# Patient Record
Sex: Male | Born: 1950 | Race: Black or African American | Hispanic: No | Smoking: Never smoker
Health system: Southern US, Community
[De-identification: ages and names within clinical notes are randomized; demographics above are authoritative.]

## PROBLEM LIST (undated history)

## (undated) DIAGNOSIS — G5621 Lesion of ulnar nerve, right upper limb: Secondary | ICD-10-CM

## (undated) DIAGNOSIS — B369 Superficial mycosis, unspecified: Secondary | ICD-10-CM

## (undated) DIAGNOSIS — I10 Essential (primary) hypertension: Secondary | ICD-10-CM

## (undated) DIAGNOSIS — Z972 Presence of dental prosthetic device (complete) (partial): Secondary | ICD-10-CM

## (undated) DIAGNOSIS — G5600 Carpal tunnel syndrome, unspecified upper limb: Secondary | ICD-10-CM

## (undated) HISTORY — PX: COLONOSCOPY: SHX174

---

## 2001-01-31 ENCOUNTER — Other Ambulatory Visit: Admission: RE | Admit: 2001-01-31 | Discharge: 2001-01-31 | Payer: Self-pay | Admitting: *Deleted

## 2001-07-11 ENCOUNTER — Encounter (INDEPENDENT_AMBULATORY_CARE_PROVIDER_SITE_OTHER): Payer: Self-pay | Admitting: *Deleted

## 2001-07-11 ENCOUNTER — Ambulatory Visit (HOSPITAL_COMMUNITY): Admission: RE | Admit: 2001-07-11 | Discharge: 2001-07-11 | Payer: Self-pay | Admitting: Gastroenterology

## 2004-07-21 ENCOUNTER — Ambulatory Visit (HOSPITAL_COMMUNITY): Admission: RE | Admit: 2004-07-21 | Discharge: 2004-07-21 | Payer: Self-pay

## 2004-07-21 ENCOUNTER — Encounter (INDEPENDENT_AMBULATORY_CARE_PROVIDER_SITE_OTHER): Payer: Self-pay | Admitting: *Deleted

## 2005-12-09 ENCOUNTER — Emergency Department (HOSPITAL_COMMUNITY): Admission: EM | Admit: 2005-12-09 | Discharge: 2005-12-09 | Payer: Self-pay | Admitting: Emergency Medicine

## 2006-06-21 ENCOUNTER — Encounter: Admission: RE | Admit: 2006-06-21 | Discharge: 2006-06-21 | Payer: Self-pay | Admitting: Internal Medicine

## 2010-03-24 ENCOUNTER — Encounter: Admission: RE | Admit: 2010-03-24 | Discharge: 2010-03-24 | Payer: Self-pay | Admitting: Internal Medicine

## 2013-04-23 ENCOUNTER — Other Ambulatory Visit: Payer: Self-pay | Admitting: Gastroenterology

## 2013-07-24 ENCOUNTER — Other Ambulatory Visit: Payer: Self-pay | Admitting: Internal Medicine

## 2013-07-24 DIAGNOSIS — R229 Localized swelling, mass and lump, unspecified: Secondary | ICD-10-CM

## 2013-07-29 ENCOUNTER — Ambulatory Visit
Admission: RE | Admit: 2013-07-29 | Discharge: 2013-07-29 | Disposition: A | Discharge status: Home / Self Care | Payer: BC Managed Care – PPO | Source: Ambulatory Visit | Attending: Internal Medicine | Admitting: Internal Medicine

## 2013-07-29 DIAGNOSIS — R229 Localized swelling, mass and lump, unspecified: Secondary | ICD-10-CM

## 2015-02-25 ENCOUNTER — Emergency Department (INDEPENDENT_AMBULATORY_CARE_PROVIDER_SITE_OTHER)
Admission: EM | Admit: 2015-02-25 | Discharge: 2015-02-25 | Disposition: A | Discharge status: Home / Self Care | Payer: Medicare Other | Source: Home / Self Care

## 2015-02-25 ENCOUNTER — Encounter (HOSPITAL_COMMUNITY): Payer: Self-pay | Admitting: Emergency Medicine

## 2015-02-25 DIAGNOSIS — S61219A Laceration without foreign body of unspecified finger without damage to nail, initial encounter: Secondary | ICD-10-CM | POA: Diagnosis not present

## 2015-02-25 HISTORY — DX: Essential (primary) hypertension: I10

## 2015-02-25 MED ORDER — CEFUROXIME AXETIL 500 MG PO TABS: 500.0000 mg | ORAL_TABLET | Freq: Two times a day (BID) | ORAL | Status: DC

## 2015-02-25 NOTE — ED Notes (Signed)
Pt cut his left index finger with a band saw two days ago.  Cut is clean and dry.

## 2015-02-25 NOTE — ED Provider Notes (Signed)
Alexander Robles is a 64 y.o. male who presents to Urgent Care today for finger laceration. Patient suffered a laceration to the dorsal aspect of his left fourth digit at the distal phalanx 2 days ago at work. 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 The port slipped in his finger accidentally touched the band saw blade. He immediately pulled his hand away from the blade. The blade cut through his work gloves. He treated the wound with irrigation and a dressing. His last tetanus vaccine was one year ago. He feels well otherwise. No fevers or chills.   Past Medical History  Diagnosis Date  . Hypertension    History reviewed. No pertinent past surgical history. History  Substance Use Topics  . Smoking status: Never Smoker   . Smokeless tobacco: Never Used  . Alcohol Use: Yes     Comment: occasional   ROS as above Medications: No current facility-administered medications for this encounter.   Current Outpatient Prescriptions  Medication Sig Dispense Refill  . doxazosin (CARDURA XL) 4 MG 24 hr tablet Take 4 mg by mouth daily with breakfast.    . losartan-hydrochlorothiazide (HYZAAR) 100-25 MG per tablet Take 1 tablet by mouth daily.    . cefUROXime (CEFTIN) 500 MG tablet Take 1 tablet (500 mg total) by mouth 2 (two) times daily with a meal. 14 tablet 0   No Known Allergies   Exam:  BP 159/98 mmHg  Pulse 91  Temp(Src) 98.8 F (37.1 C) (Oral)  Resp 16  SpO2 100% Gen: Well NAD Left hand fourth digit distal phalanx laceration obliquely across the distal phalanx through the nail. It does not involve the joint. No deep structures involved. No bleeding. Well-appearing laceration. Distal cap refill and sensation intact.  No results found for this or any previous visit (from the past 24 hour(s)). No results found.  Assessment and Plan: 64 y.o. male with laceration to the left fourth digit distal phalanx. No deep structures involved. Laceration is at least 58 hours old. I cannot  repair with suture. We'll allow healing by secondary intention. Tetanus up-to-date. Treat with Ceftin for prophylactic antibiotics as patient cut his hand with a dirty saw contaminated with raw pork.  Informed patient that he will likely have a dystrophic fingernail. Return to work on Monday. Follow up with occupational health. Work related injury.  Discussed warning signs or symptoms. Please see discharge instructions. Patient expresses understanding.     Gregor Hams, MD 02/25/15 [phone]

## 2015-02-25 NOTE — Discharge Instructions (Signed)
Thank you for coming in today.  Laceration Care, Adult A laceration is a cut or lesion that goes through all layers of the skin and into the tissue just beneath the skin. TREATMENT  Some lacerations may not require closure. Some lacerations may not be able to be closed due to an increased risk of infection. It is important to see your caregiver as soon as possible after an injury to minimize the risk of infection and maximize the opportunity for successful closure. If closure is appropriate, pain medicines may be given, if needed. The wound will be cleaned to help prevent infection. Your caregiver will use stitches (sutures), staples, wound glue (adhesive), or skin adhesive strips to repair the laceration. These tools bring the skin edges together to allow for faster healing and a better cosmetic outcome. However, all wounds will heal with a scar. Once the wound has healed, scarring can be minimized by covering the wound with sunscreen during the day for 1 full year. HOME CARE INSTRUCTIONS  For sutures or staples:  Keep the wound clean and dry.  If you were given a bandage (dressing), you should change it at least once a day. Also, change the dressing if it becomes wet or dirty, or as directed by your caregiver.  Wash the wound with soap and water 2 times a day. Rinse the wound off with water to remove all soap. Pat the wound dry with a clean towel.  After cleaning, apply a thin layer of the antibiotic ointment as recommended by your caregiver. This will help prevent infection and keep the dressing from sticking.  You may shower as usual after the first 24 hours. Do not soak the wound in water until the sutures are removed.  Only take over-the-counter or prescription medicines for pain, discomfort, or fever as directed by your caregiver.  Get your sutures or staples removed as directed by your caregiver. For skin adhesive strips:  Keep the wound clean and dry.  Do not get the skin adhesive  strips wet. You may bathe carefully, using caution to keep the wound dry.  If the wound gets wet, pat it dry with a clean towel.  Skin adhesive strips will fall off on their own. You may trim the strips as the wound heals. Do not remove skin adhesive strips that are still stuck to the wound. They will fall off in time. For wound adhesive:  You may briefly wet your wound in the shower or bath. Do not soak or scrub the wound. Do not swim. Avoid periods of heavy perspiration until the skin adhesive has fallen off on its own. After showering or bathing, gently pat the wound dry with a clean towel.  Do not apply liquid medicine, cream medicine, or ointment medicine to your wound while the skin adhesive is in place. This may loosen the film before your wound is healed.  If a dressing is placed over the wound, be careful not to apply tape directly over the skin adhesive. This may cause the adhesive to be pulled off before the wound is healed.  Avoid prolonged exposure to sunlight or tanning lamps while the skin adhesive is in place. Exposure to ultraviolet light in the first year will darken the scar.  The skin adhesive will usually remain in place for 5 to 10 days, then naturally fall off the skin. Do not pick at the adhesive film. You may need a tetanus shot if:  You cannot remember when you had your last tetanus shot.  You have never had a tetanus shot. If you get a tetanus shot, your arm may swell, get red, and feel warm to the touch. This is common and not a problem. If you need a tetanus shot and you choose not to have one, there is a rare chance of getting tetanus. Sickness from tetanus can be serious. SEEK MEDICAL CARE IF:   You have redness, swelling, or increasing pain in the wound.  You see a red line that goes away from the wound.  You have yellowish-white fluid (pus) coming from the wound.  You have a fever.  You notice a bad smell coming from the wound or dressing.  Your  wound breaks open before or after sutures have been removed.  You notice something coming out of the wound such as wood or glass.  Your wound is on your hand or foot and you cannot move a finger or toe. SEEK IMMEDIATE MEDICAL CARE IF:   Your pain is not controlled with prescribed medicine.  You have severe swelling around the wound causing pain and numbness or a change in color in your arm, hand, leg, or foot.  Your wound splits open and starts bleeding.  You have worsening numbness, weakness, or loss of function of any joint around or beyond the wound.  You develop painful lumps near the wound or on the skin anywhere on your body. MAKE SURE YOU:   Understand these instructions.  Will watch your condition.  Will get help right away if you are not doing well or get worse. Document Released: 10/29/2005 Document Revised: 01/21/2012 Document Reviewed: 04/24/2011 [facility] Patient Information 2015 [place], [place]. This information is not intended to replace advice given to you by your health care provider. Make sure you discuss any questions you have with your health care provider.

## 2016-03-13 ENCOUNTER — Encounter (HOSPITAL_COMMUNITY): Payer: Self-pay | Admitting: Emergency Medicine

## 2016-03-13 ENCOUNTER — Ambulatory Visit (HOSPITAL_COMMUNITY)
Admission: EM | Admit: 2016-03-13 | Discharge: 2016-03-13 | Disposition: A | Discharge status: Home / Self Care | Payer: Medicare Other | Attending: Emergency Medicine | Admitting: Emergency Medicine

## 2016-03-13 DIAGNOSIS — Z9119 Patient's noncompliance with other medical treatment and regimen: Secondary | ICD-10-CM

## 2016-03-13 DIAGNOSIS — M7551 Bursitis of right shoulder: Secondary | ICD-10-CM

## 2016-03-13 DIAGNOSIS — I159 Secondary hypertension, unspecified: Secondary | ICD-10-CM | POA: Diagnosis not present

## 2016-03-13 DIAGNOSIS — Z9114 Patient's other noncompliance with medication regimen: Secondary | ICD-10-CM

## 2016-03-13 MED ORDER — DICLOFENAC SODIUM 1 % TD GEL: 1.0000 "application " | Freq: Four times a day (QID) | TRANSDERMAL | Status: DC

## 2016-03-13 MED ORDER — TRAMADOL HCL 50 MG PO TABS: ORAL_TABLET | ORAL | Status: DC

## 2016-03-13 NOTE — ED Provider Notes (Signed)
HPI  SUBJECTIVE:  Alexander Robles is a 65 y.o. male who presents with sharp constant, nonradiating right shoulder pain when he pulls his pants up starting yesterday. Symptoms are worse with the specific motion, no alleviating factors. He tried Tylenol arthritis for this. reports no pain with AB or AB duction, extension or flexion, internal/ external rotation. He states that he did a lot of rowing/pull-ups with a 15 pound dumbbell this weekend. He denies any other trauma to his shoulder, numbness, tingly, weakness, fevers, swelling, erythema. Past medical history includes hypertension, states that he ran out of his medications proximally one month ago. States that he takes "two meds" for his high blood pressure- a "blue pill and a yellow pill," but is not sure which one is which. Thinks that the yellow pill may be the losartan hydrochlorothiazide. Past medical history negative for shoulder injury, rotator cuff injury, diabetes, MI, kidney disease, stroke. PMD: Dr. Benita Stabile at [facility] [place].   Past Medical History  Diagnosis Date  . Hypertension     History reviewed. No pertinent past surgical history.  History reviewed. No pertinent family history.  Social History  Substance Use Topics  . Smoking status: Never Smoker   . Smokeless tobacco: Never Used  . Alcohol Use: Yes     Comment: occasional    No current facility-administered medications for this encounter.  Current outpatient prescriptions:  .  diclofenac sodium (VOLTAREN) 1 % GEL, Apply 1 application topically 4 (four) times daily., Disp: 100 g, Rfl: 0 .  doxazosin (CARDURA XL) 4 MG 24 hr tablet, Take 4 mg by mouth daily with breakfast., Disp: , Rfl:  .  losartan-hydrochlorothiazide (HYZAAR) 100-25 MG per tablet, Take 1 tablet by mouth daily., Disp: , Rfl:  .  traMADol (ULTRAM) 50 MG tablet, 1-2 tabs po q 6 hr prn pain Maximum dose= 8 tablets per day, Disp: 20 tablet, Rfl: 0  No Known Allergies   ROS  As noted in  HPI.   Physical Exam  BP 199/96 mmHg  Pulse 79  Temp(Src) 98.3 F (36.8 C) (Oral)  SpO2 99%   BP Readings from Last 3 Encounters:  03/13/16 199/96  02/25/15 159/98   Constitutional: Well developed, well nourished, no acute distress Eyes: PERRL, EOMI, conjunctiva normal bilaterally HENT: Normocephalic, atraumatic,mucus membranes moist Respiratory: Clear to auscultation bilaterally, no rales, no wheezing, no rhonchi Cardiovascular: Normal rate and rhythm, no murmurs, no gallops, no rubs GI: Soft, nondistended, normal bowel sounds, nontender, no rebound, no guarding Back: no CVAT skin: No rash, skin intact Musculoskeletal:calves symmetric, nontender No edema, no tenderness, no deformities Shoulder: R shoulder with ROM normal. Tenderness at the top of the shoulder, at the bursa.  Drop test normal , clavicle NT, A/C joint NT , scapula NT , proximal humerus NT, supraspinatus nontender,  Motor strength normal, Sensation intact LT over deltoid region, distal NVI with hand having intact sensation and strength in the distribution of the median, radial, and ulnar nerve.  no pain with internal rotation, no pain with external rotation, negative tenderness in bicipital groove, negative empty can test negative liftoff test, negative "popeye" sign, no instability with abduction/external rotation. Neurologic: Alert & oriented x 3, CN II-XII grossly intact, no motor deficits, sensation grossly intact Psychiatric: Speech and behavior appropriate   ED Course   Medications - No data to display  No orders of the defined types were placed in this encounter.   No results found for this or any previous visit (from the past 24  hour(s)). No results found.  ED Clinical Impression  Shoulder bursitis, right  Secondary hypertension, unspecified  H/O medication noncompliance  ED Assessment/Plan  Presentation most consistent with a shoulder bursitis. We'll send home with tramadol, voltaren gel as do  not know what kidney function is,Tylenol, ice, rest. Follow-up with orthopedics in 10 days if no better with this.  Pt hypertensive today. Has not taken BP meds in about a month. States that he has a yellow pill, which I believe is losartan/hydrochlorothiazide, and a blue pill, which I believe is Cardura. He states that he ran out of losartan hydrochlorothiazide one month ago, but she still has some of the Cardura at home. Pt has no evidence of end organ damage on hx. Pt denies any CNS type sx such as HA, visual changes, focal paresis, or new onset seizure activity. Pt denies any CV sx such as CP, dyspnea, palpitations, pedal edema, tearing pain radiating to back or abd. Pt denied any renal sx such as anuria or hematuria. Pt denies illicit drug use, most notably cocaine, or recent use of OTC medications such as nasal decongestants. Discussed importance of taking usual BP medications. He is to make a list of his medications as well so that he can help with the provider identify which medication is which. He states that he can get an appointment with his doctor tomorrow. We'll also send an inbox message to Dr. Donnamarie Rossetti at [place].   Discussed  plan and followup with patient. Discussed sn/sx that should prompt return to the  ED. Patient agrees with plan.   *This clinic note was created using Dragon dictation software. Therefore, there may be occasional mistakes despite careful proofreading.  ?   Melynda Ripple, MD 03/14/16 [phone]

## 2016-03-13 NOTE — ED Notes (Signed)
Pt here today with complaints of right shoulder pain.  He has full ROM but states he noticed extreme pain when trying to put his pants on and pull them up this morning.  Upon assessment, HBP was discovered.  Pt states he is supposed to be on HBP medication but has not seen his doctor in a year.  I was unable to determine exactly when he took his last HBP medication.

## 2016-03-13 NOTE — Discharge Instructions (Signed)
Decrease your salt intake. diet and exercise will lower your blood pressure significantly. It is important to keep your blood pressure under good control, as having a elevated for prolonged periods of time significantly increases your risk of stroke, heart attacks, kidney damage, eye damage, and other problems. Follow up with your doctor tomorrow. Return immediately to the ER if you start having chest pain, headache, problems seeing, problems talking, problems walking, if you feel like you're about to pass out, if you do pass out, if you have a seizure, or for any other concerns.[name]

## 2016-03-14 DIAGNOSIS — I10 Essential (primary) hypertension: Secondary | ICD-10-CM | POA: Diagnosis not present

## 2016-06-11 DIAGNOSIS — N471 Phimosis: Secondary | ICD-10-CM | POA: Diagnosis not present

## 2016-06-11 DIAGNOSIS — I10 Essential (primary) hypertension: Secondary | ICD-10-CM | POA: Diagnosis not present

## 2016-06-11 DIAGNOSIS — M179 Osteoarthritis of knee, unspecified: Secondary | ICD-10-CM | POA: Diagnosis not present

## 2016-06-11 DIAGNOSIS — S3021XA Contusion of penis, initial encounter: Secondary | ICD-10-CM | POA: Diagnosis not present

## 2016-06-11 DIAGNOSIS — R030 Elevated blood-pressure reading, without diagnosis of hypertension: Secondary | ICD-10-CM | POA: Diagnosis not present

## 2016-06-11 DIAGNOSIS — N4889 Other specified disorders of penis: Secondary | ICD-10-CM | POA: Diagnosis not present

## 2016-06-15 DIAGNOSIS — N471 Phimosis: Secondary | ICD-10-CM | POA: Diagnosis not present

## 2016-06-15 DIAGNOSIS — N5201 Erectile dysfunction due to arterial insufficiency: Secondary | ICD-10-CM | POA: Diagnosis not present

## 2016-06-26 DIAGNOSIS — M179 Osteoarthritis of knee, unspecified: Secondary | ICD-10-CM | POA: Diagnosis not present

## 2016-06-26 DIAGNOSIS — N471 Phimosis: Secondary | ICD-10-CM | POA: Diagnosis not present

## 2016-06-26 DIAGNOSIS — I10 Essential (primary) hypertension: Secondary | ICD-10-CM | POA: Diagnosis not present

## 2016-08-10 DIAGNOSIS — R7303 Prediabetes: Secondary | ICD-10-CM | POA: Diagnosis not present

## 2016-08-10 DIAGNOSIS — Z125 Encounter for screening for malignant neoplasm of prostate: Secondary | ICD-10-CM | POA: Diagnosis not present

## 2016-08-10 DIAGNOSIS — J309 Allergic rhinitis, unspecified: Secondary | ICD-10-CM | POA: Diagnosis not present

## 2016-08-10 DIAGNOSIS — I10 Essential (primary) hypertension: Secondary | ICD-10-CM | POA: Diagnosis not present

## 2016-08-10 DIAGNOSIS — Z1159 Encounter for screening for other viral diseases: Secondary | ICD-10-CM | POA: Diagnosis not present

## 2016-08-10 DIAGNOSIS — Z1389 Encounter for screening for other disorder: Secondary | ICD-10-CM | POA: Diagnosis not present

## 2016-08-10 DIAGNOSIS — N529 Male erectile dysfunction, unspecified: Secondary | ICD-10-CM | POA: Diagnosis not present

## 2016-08-10 DIAGNOSIS — E669 Obesity, unspecified: Secondary | ICD-10-CM | POA: Diagnosis not present

## 2016-08-10 DIAGNOSIS — M519 Unspecified thoracic, thoracolumbar and lumbosacral intervertebral disc disorder: Secondary | ICD-10-CM | POA: Diagnosis not present

## 2016-08-10 DIAGNOSIS — Z23 Encounter for immunization: Secondary | ICD-10-CM | POA: Diagnosis not present

## 2016-08-10 DIAGNOSIS — Z0000 Encounter for general adult medical examination without abnormal findings: Secondary | ICD-10-CM | POA: Diagnosis not present

## 2016-10-11 ENCOUNTER — Ambulatory Visit (INDEPENDENT_AMBULATORY_CARE_PROVIDER_SITE_OTHER): Payer: Medicare Other

## 2016-10-11 ENCOUNTER — Ambulatory Visit (HOSPITAL_COMMUNITY)
Admission: EM | Admit: 2016-10-11 | Discharge: 2016-10-11 | Disposition: A | Discharge status: Home / Self Care | Payer: Medicare Other | Attending: Family Medicine | Admitting: Family Medicine

## 2016-10-11 ENCOUNTER — Encounter (HOSPITAL_COMMUNITY): Payer: Self-pay | Admitting: Emergency Medicine

## 2016-10-11 DIAGNOSIS — M25551 Pain in right hip: Secondary | ICD-10-CM

## 2016-10-11 DIAGNOSIS — M25559 Pain in unspecified hip: Secondary | ICD-10-CM | POA: Diagnosis not present

## 2016-10-11 DIAGNOSIS — T148XXA Other injury of unspecified body region, initial encounter: Secondary | ICD-10-CM

## 2016-10-11 DIAGNOSIS — M1611 Unilateral primary osteoarthritis, right hip: Secondary | ICD-10-CM | POA: Diagnosis not present

## 2016-10-11 DIAGNOSIS — M79609 Pain in unspecified limb: Secondary | ICD-10-CM

## 2016-10-11 DIAGNOSIS — R52 Pain, unspecified: Secondary | ICD-10-CM | POA: Diagnosis not present

## 2016-10-11 MED ORDER — NAPROXEN 375 MG PO TABS: 375.0000 mg | ORAL_TABLET | Freq: Two times a day (BID) | ORAL | 0 refills | Status: DC

## 2016-10-11 MED ORDER — TRAMADOL HCL 50 MG PO TABS: 50.0000 mg | ORAL_TABLET | Freq: Four times a day (QID) | ORAL | 0 refills | Status: DC | PRN

## 2016-10-11 NOTE — ED Notes (Signed)
Instructed to put on gown 

## 2016-10-11 NOTE — ED Triage Notes (Signed)
Pt was suffering from right lower back pain a few months ago and was given Robaxin and Celebrex for the pain, but it did not help him.  Pt is here today for pain in the same area but much more intense than before.  Pt has no urinary symptoms and no fever.

## 2016-10-11 NOTE — ED Provider Notes (Signed)
CSN: [id number]     Arrival date & time 10/11/16  1014 History   First MD Initiated Contact with Patient 10/11/16 1205     Chief Complaint  Patient presents with  . Back Pain   (Consider location/radiation/quality/duration/timing/severity/associated sxs/prior Treatment) 65 year old male complaining of back pain on the right side for 2 months. He states that he had fallen. He is unable to give any more specifics regarding the onset of pain the pain is located over the right hip particularly in the lateral iliac area. He saw his PCP approximately 2 months ago and was given a couple of medications. He felt that the medication would cure the ailment  after finishing the medication, but he was no better. He did not attempt to go back and see his PCP. He states the pain is constant and nonradiating. It is worse with movement, lifting the leg, ambulation, the act of sitting and standing.      Past Medical History:  Diagnosis Date  . Hypertension    History reviewed. No pertinent surgical history. History reviewed. No pertinent family history. Social History  Substance Use Topics  . Smoking status: Never Smoker  . Smokeless tobacco: Never Used  . Alcohol use Yes     Comment: occasional    Review of Systems  Constitutional: Positive for activity change.  HENT: Negative.   Respiratory: Negative.   Gastrointestinal: Negative.   Genitourinary: Negative.   Musculoskeletal: Positive for back pain and myalgias. Negative for joint swelling, neck pain and neck stiffness.       As per HPI  Skin: Negative.   Neurological: Negative for dizziness, weakness, numbness and headaches.  All other systems reviewed and are negative.   Allergies  Patient has no known allergies.  Home Medications   Prior to Admission medications   Medication Sig Start Date End Date Taking? Authorizing Provider  diclofenac sodium (VOLTAREN) 1 % GEL Apply 1 application topically 4 (four) times daily. 03/13/16  Yes  Melynda Ripple, MD  doxazosin (CARDURA XL) 4 MG 24 hr tablet Take 4 mg by mouth daily with breakfast.   Yes Historical Provider, MD  losartan-hydrochlorothiazide (HYZAAR) 100-25 MG per tablet Take 1 tablet by mouth daily.   Yes Historical Provider, MD  methocarbamol (ROBAXIN) 500 MG tablet Take 500 mg by mouth 3 (three) times daily.   Yes Historical Provider, MD  naproxen (NAPROSYN) 375 MG tablet Take 1 tablet (375 mg total) by mouth 2 (two) times daily. Take with food. 10/11/16   Janne Napoleon, NP  traMADol (ULTRAM) 50 MG tablet Take 1 tablet (50 mg total) by mouth every 6 (six) hours as needed. 10/11/16   Janne Napoleon, NP   Meds Ordered and Administered this Visit  Medications - No data to display  BP 112/74 (BP Location: Right Arm)   Pulse 78   Temp 98 F (36.7 C) (Oral)   SpO2 96%  No data found.   Physical Exam  Constitutional: He is oriented to person, place, and time. He appears well-developed and well-nourished. No distress.  HENT:  Head: Normocephalic and atraumatic.  Eyes: EOM are normal.  Neck: Normal range of motion. Neck supple.  Cardiovascular: Normal rate.   Pulmonary/Chest: Effort normal. No respiratory distress.  Musculoskeletal: He exhibits no edema.  The patient points to the ventricular gluteal, gluteus medius and aponeurosis area of the right pelvis as the source of pain. Am unable to reproduce pain with deep palpation. I will having the patient lie supine and performing a straight  leg lifts this reproduces the pain and he is unable to maintain this position for a long period having the patient abduct the right leg also produces pain in the same area. No tenderness to the lateral thigh with percussion or palpation. No tenderness to the anterior thigh. No deformity, swelling or discoloration.  Neurological: He is alert and oriented to person, place, and time. He exhibits normal muscle tone.  Skin: Skin is warm and dry.  Psychiatric: He has a normal mood and affect.   Nursing note and vitals reviewed.   Urgent Care Course   Clinical Course     Procedures (including critical care time)  Labs Review Labs Reviewed - No data to display  Imaging Review Dg Hip Unilat With Pelvis 2-3 Views Right  Result Date: 10/11/2016 CLINICAL DATA:  65 year old male with a history of right lower hip pain for 2 months. EXAM: DG HIP (WITH OR WITHOUT PELVIS) 2-3V RIGHT COMPARISON:  CT abdomen 03/24/2010 FINDINGS: No displaced acute fracture identified. Bilateral changes of hip osteoarthritis. No focal soft tissue swelling.  No radiopaque foreign body. IMPRESSION: Negative for acute bony abnormality. Evidence of developing bilateral osteoarthritis. Signed, Dulcy Fanny. Earleen Newport, DO [facility] Electronically Signed   By: Corrie Mckusick D.O.   On: 10/11/2016 12:54     Visual Acuity Review  Right Eye Distance:   Left Eye Distance:   Bilateral Distance:    Right Eye Near:   Left Eye Near:    Bilateral Near:         MDM   1. Musculoskeletal pain of extremity   2. Pain, hip   3. Hip pain, acute, right   4. Muscle strain   5. Pain of right hip joint    The pain in the right pelvis and hip appears to be musculoskeletal. The bones are intact, nothing appears to be broken or out of place. He did have some arthritis but that does not seem to be the cause of all of the problem. Most of the problem appears to rest within the muscles and maybe even the tendons to a portion of the hip and the right upper pelvis. Apply heat to the muscles to 3 times a day. Take the medications as directed. He may need to have physical therapy. Call the orthopedist listed on page one for an appointment and follow-up. Meds ordered this encounter  Medications  . methocarbamol (ROBAXIN) 500 MG tablet    Sig: Take 500 mg by mouth 3 (three) times daily.  [name] DISCONTD: celecoxib (CELEBREX) 200 MG capsule    Sig: Take 200 mg by mouth daily.  .  naproxen (NAPROSYN) 375 MG tablet    Sig: Take 1 tablet (375 mg total) by mouth 2 (two) times daily. Take with food.    Dispense:  20 tablet    Refill:  0    Order Specific Question:   Supervising Provider    Answer:   Melony Overly [id number]  . traMADol (ULTRAM) 50 MG tablet    Sig: Take 1 tablet (50 mg total) by mouth every 6 (six) hours as needed.    Dispense:  15 tablet    Refill:  0    Order Specific Question:   Supervising Provider    Answer:   Carmela Hurt       Janne Napoleon, NP 10/11/16 1335

## 2016-10-11 NOTE — Discharge Instructions (Signed)
The pain in the right pelvis and hip appears to be musculoskeletal. The bones are intact, nothing appears to be broken or out of place. He did have some arthritis but that does not seem to be the cause of all of the problem. Most of the problem appears to rest within the muscles and maybe even the tendons to a portion of the hip and the right upper pelvis. Apply heat to the muscles to 3 times a day. Take the medications as directed. He may need to have physical therapy. Call the orthopedist listed on page one for an appointment and follow-up.

## 2017-01-25 DIAGNOSIS — M5136 Other intervertebral disc degeneration, lumbar region: Secondary | ICD-10-CM | POA: Diagnosis not present

## 2017-01-25 DIAGNOSIS — M170 Bilateral primary osteoarthritis of knee: Secondary | ICD-10-CM | POA: Diagnosis not present

## 2017-01-25 DIAGNOSIS — M545 Low back pain: Secondary | ICD-10-CM | POA: Diagnosis not present

## 2017-02-01 DIAGNOSIS — I10 Essential (primary) hypertension: Secondary | ICD-10-CM | POA: Diagnosis not present

## 2017-02-01 DIAGNOSIS — R7303 Prediabetes: Secondary | ICD-10-CM | POA: Diagnosis not present

## 2017-02-01 DIAGNOSIS — J309 Allergic rhinitis, unspecified: Secondary | ICD-10-CM | POA: Diagnosis not present

## 2017-02-01 DIAGNOSIS — M179 Osteoarthritis of knee, unspecified: Secondary | ICD-10-CM | POA: Diagnosis not present

## 2017-07-08 DIAGNOSIS — R202 Paresthesia of skin: Secondary | ICD-10-CM | POA: Diagnosis not present

## 2017-07-12 DIAGNOSIS — G5621 Lesion of ulnar nerve, right upper limb: Secondary | ICD-10-CM | POA: Diagnosis not present

## 2017-07-17 ENCOUNTER — Other Ambulatory Visit: Payer: Self-pay | Admitting: Orthopedic Surgery

## 2017-07-17 DIAGNOSIS — G5621 Lesion of ulnar nerve, right upper limb: Secondary | ICD-10-CM | POA: Diagnosis not present

## 2017-07-17 DIAGNOSIS — G5622 Lesion of ulnar nerve, left upper limb: Secondary | ICD-10-CM | POA: Diagnosis not present

## 2017-07-17 DIAGNOSIS — G5602 Carpal tunnel syndrome, left upper limb: Secondary | ICD-10-CM | POA: Diagnosis not present

## 2017-07-17 DIAGNOSIS — G5603 Carpal tunnel syndrome, bilateral upper limbs: Secondary | ICD-10-CM | POA: Diagnosis not present

## 2017-07-26 DIAGNOSIS — I10 Essential (primary) hypertension: Secondary | ICD-10-CM | POA: Diagnosis not present

## 2017-07-26 DIAGNOSIS — E669 Obesity, unspecified: Secondary | ICD-10-CM | POA: Diagnosis not present

## 2017-07-26 DIAGNOSIS — Z1211 Encounter for screening for malignant neoplasm of colon: Secondary | ICD-10-CM | POA: Diagnosis not present

## 2017-07-26 DIAGNOSIS — E782 Mixed hyperlipidemia: Secondary | ICD-10-CM | POA: Diagnosis not present

## 2017-07-26 DIAGNOSIS — J309 Allergic rhinitis, unspecified: Secondary | ICD-10-CM | POA: Diagnosis not present

## 2017-07-26 DIAGNOSIS — M519 Unspecified thoracic, thoracolumbar and lumbosacral intervertebral disc disorder: Secondary | ICD-10-CM | POA: Diagnosis not present

## 2017-07-26 DIAGNOSIS — R7309 Other abnormal glucose: Secondary | ICD-10-CM | POA: Diagnosis not present

## 2017-07-26 DIAGNOSIS — G5600 Carpal tunnel syndrome, unspecified upper limb: Secondary | ICD-10-CM | POA: Diagnosis not present

## 2017-07-26 DIAGNOSIS — Z1389 Encounter for screening for other disorder: Secondary | ICD-10-CM | POA: Diagnosis not present

## 2017-07-26 DIAGNOSIS — Z0000 Encounter for general adult medical examination without abnormal findings: Secondary | ICD-10-CM | POA: Diagnosis not present

## 2017-07-26 DIAGNOSIS — Z7189 Other specified counseling: Secondary | ICD-10-CM | POA: Diagnosis not present

## 2017-07-26 DIAGNOSIS — R7303 Prediabetes: Secondary | ICD-10-CM | POA: Diagnosis not present

## 2017-07-26 DIAGNOSIS — Z23 Encounter for immunization: Secondary | ICD-10-CM | POA: Diagnosis not present

## 2017-07-26 DIAGNOSIS — Z6836 Body mass index (BMI) 36.0-36.9, adult: Secondary | ICD-10-CM | POA: Diagnosis not present

## 2017-07-26 DIAGNOSIS — N400 Enlarged prostate without lower urinary tract symptoms: Secondary | ICD-10-CM | POA: Diagnosis not present

## 2017-07-26 DIAGNOSIS — M179 Osteoarthritis of knee, unspecified: Secondary | ICD-10-CM | POA: Diagnosis not present

## 2017-07-29 ENCOUNTER — Encounter (HOSPITAL_BASED_OUTPATIENT_CLINIC_OR_DEPARTMENT_OTHER): Payer: Self-pay | Admitting: *Deleted

## 2017-07-29 NOTE — Pre-Procedure Instructions (Signed)
To come for BMET and EKG 

## 2017-07-29 NOTE — Progress Notes (Signed)
   07/29/17 1536  OBSTRUCTIVE SLEEP APNEA  Have you ever been diagnosed with sleep apnea through a sleep study? No  Do you snore loudly (loud enough to be heard through closed doors)?  0  Do you often feel tired, fatigued, or sleepy during the daytime (such as falling asleep during driving or talking to someone)? 0  Has anyone observed you stop breathing during your sleep? 0  Do you have, or are you being treated for high blood pressure? 1  BMI more than 35 kg/m2? 1  Age > 61 (1-yes) 1  Male Gender (Yes=1) 1  Obstructive Sleep Apnea Score 4  Score 5 or greater  Results sent to PCP

## 2017-08-01 ENCOUNTER — Encounter (HOSPITAL_BASED_OUTPATIENT_CLINIC_OR_DEPARTMENT_OTHER)
Admission: RE | Admit: 2017-08-01 | Discharge: 2017-08-01 | Disposition: A | Discharge status: Home / Self Care | Payer: Medicare Other | Source: Ambulatory Visit | Attending: Orthopedic Surgery | Admitting: Orthopedic Surgery

## 2017-08-01 DIAGNOSIS — G5601 Carpal tunnel syndrome, right upper limb: Secondary | ICD-10-CM | POA: Diagnosis not present

## 2017-08-01 DIAGNOSIS — Z79899 Other long term (current) drug therapy: Secondary | ICD-10-CM | POA: Diagnosis not present

## 2017-08-01 DIAGNOSIS — G5621 Lesion of ulnar nerve, right upper limb: Secondary | ICD-10-CM | POA: Diagnosis not present

## 2017-08-01 DIAGNOSIS — I10 Essential (primary) hypertension: Secondary | ICD-10-CM | POA: Diagnosis not present

## 2017-08-01 LAB — BASIC METABOLIC PANEL
Anion gap: 9 (ref 5–15)
BUN: 13 mg/dL (ref 6–20)
CO2: 26 mmol/L (ref 22–32)
Calcium: 8.7 mg/dL — ABNORMAL LOW (ref 8.9–10.3)
Chloride: 108 mmol/L (ref 101–111)
Creatinine, Ser: 0.9 mg/dL (ref 0.61–1.24)
GFR calc Af Amer: >60 mL/min (ref >60)
GFR calc non Af Amer: >60 mL/min (ref >60)
Glucose, Bld: 111 mg/dL — ABNORMAL HIGH (ref 65–99)
Potassium: 4 mmol/L (ref 3.5–5.1)
Sodium: 143 mmol/L (ref 135–145)

## 2017-08-01 NOTE — Progress Notes (Signed)
EKG reviewed by Dr. Miller, will proceed with surgery as scheduled.  

## 2017-08-02 ENCOUNTER — Encounter (HOSPITAL_BASED_OUTPATIENT_CLINIC_OR_DEPARTMENT_OTHER)
Admission: RE | Disposition: A | Discharge status: Home / Self Care | Payer: Self-pay | Source: Ambulatory Visit | Attending: Orthopedic Surgery

## 2017-08-02 ENCOUNTER — Ambulatory Visit (HOSPITAL_BASED_OUTPATIENT_CLINIC_OR_DEPARTMENT_OTHER)
Admission: RE | Admit: 2017-08-02 | Discharge: 2017-08-02 | Disposition: A | Discharge status: Home / Self Care | Payer: Medicare Other | Source: Ambulatory Visit | Attending: Orthopedic Surgery | Admitting: Orthopedic Surgery

## 2017-08-02 ENCOUNTER — Ambulatory Visit (HOSPITAL_BASED_OUTPATIENT_CLINIC_OR_DEPARTMENT_OTHER): Payer: Medicare Other | Admitting: Anesthesiology

## 2017-08-02 ENCOUNTER — Encounter (HOSPITAL_BASED_OUTPATIENT_CLINIC_OR_DEPARTMENT_OTHER): Payer: Self-pay | Admitting: *Deleted

## 2017-08-02 DIAGNOSIS — G5621 Lesion of ulnar nerve, right upper limb: Secondary | ICD-10-CM | POA: Diagnosis not present

## 2017-08-02 DIAGNOSIS — G5601 Carpal tunnel syndrome, right upper limb: Secondary | ICD-10-CM | POA: Diagnosis not present

## 2017-08-02 DIAGNOSIS — Z79899 Other long term (current) drug therapy: Secondary | ICD-10-CM | POA: Diagnosis not present

## 2017-08-02 DIAGNOSIS — I10 Essential (primary) hypertension: Secondary | ICD-10-CM | POA: Insufficient documentation

## 2017-08-02 DIAGNOSIS — G8918 Other acute postprocedural pain: Secondary | ICD-10-CM | POA: Diagnosis not present

## 2017-08-02 HISTORY — DX: Carpal tunnel syndrome, unspecified upper limb: G56.00

## 2017-08-02 HISTORY — DX: Presence of dental prosthetic device (complete) (partial): Z97.2

## 2017-08-02 HISTORY — DX: Superficial mycosis, unspecified: B36.9

## 2017-08-02 HISTORY — DX: Lesion of ulnar nerve, right upper limb: G56.21

## 2017-08-02 HISTORY — PX: CARPAL TUNNEL RELEASE: SHX101

## 2017-08-02 HISTORY — PX: ULNAR NERVE TRANSPOSITION: SHX2595

## 2017-08-02 SURGERY — CARPAL TUNNEL RELEASE
Anesthesia: General | Site: Hand | Laterality: Right

## 2017-08-02 MED ORDER — CEFAZOLIN SODIUM-DEXTROSE 2-4 GM/100ML-% IV SOLN: 2.0000 g | INTRAVENOUS | Status: DC

## 2017-08-02 MED ORDER — MIDAZOLAM HCL 2 MG/2ML IJ SOLN
1.0000 mg | INTRAMUSCULAR | Status: DC | PRN
  Administered 2017-08-02: 2 mg via INTRAVENOUS

## 2017-08-02 MED ORDER — HYDROCODONE-ACETAMINOPHEN 10-325 MG PO TABS: 1.0000 | ORAL_TABLET | Freq: Four times a day (QID) | ORAL | 0 refills | Status: DC | PRN

## 2017-08-02 MED ORDER — CHLORHEXIDINE GLUCONATE 4 % EX LIQD: 60.0000 mL | Freq: Once | CUTANEOUS | Status: DC

## 2017-08-02 MED ORDER — CEFAZOLIN SODIUM-DEXTROSE 2-4 GM/100ML-% IV SOLN
INTRAVENOUS | Status: AC
  Filled 2017-08-02: qty 100

## 2017-08-02 MED ORDER — BUPIVACAINE-EPINEPHRINE (PF) 0.5% -1:200000 IJ SOLN
INTRAMUSCULAR | Status: DC | PRN
  Administered 2017-08-02: 30 mL via PERINEURAL

## 2017-08-02 MED ORDER — FENTANYL CITRATE (PF) 100 MCG/2ML IJ SOLN
50.0000 ug | INTRAMUSCULAR | Status: AC | PRN
  Administered 2017-08-02 (×3): 50 ug via INTRAVENOUS

## 2017-08-02 MED ORDER — CEFAZOLIN SODIUM-DEXTROSE 2-3 GM-% IV SOLR
INTRAVENOUS | Status: DC | PRN
  Administered 2017-08-02: 2 g via INTRAVENOUS

## 2017-08-02 MED ORDER — PHENYLEPHRINE HCL 10 MG/ML IJ SOLN
INTRAMUSCULAR | Status: DC | PRN
  Administered 2017-08-02 (×3): 80 ug via INTRAVENOUS

## 2017-08-02 MED ORDER — EPHEDRINE SULFATE 50 MG/ML IJ SOLN
INTRAMUSCULAR | Status: DC | PRN
  Administered 2017-08-02: 15 mg via INTRAVENOUS
  Administered 2017-08-02 (×2): 10 mg via INTRAVENOUS

## 2017-08-02 MED ORDER — PROPOFOL 10 MG/ML IV BOLUS
INTRAVENOUS | Status: AC
  Filled 2017-08-02: qty 20

## 2017-08-02 MED ORDER — GLYCOPYRROLATE 0.2 MG/ML IJ SOLN
INTRAMUSCULAR | Status: DC | PRN
  Administered 2017-08-02: 0.2 mg via INTRAVENOUS

## 2017-08-02 MED ORDER — FENTANYL CITRATE (PF) 100 MCG/2ML IJ SOLN
INTRAMUSCULAR | Status: AC
  Filled 2017-08-02: qty 2

## 2017-08-02 MED ORDER — PROMETHAZINE HCL 25 MG/ML IJ SOLN: 6.2500 mg | INTRAMUSCULAR | Status: DC | PRN

## 2017-08-02 MED ORDER — PROPOFOL 10 MG/ML IV BOLUS
INTRAVENOUS | Status: DC | PRN
  Administered 2017-08-02 (×2): 200 mg via INTRAVENOUS
  Administered 2017-08-02: 100 mg via INTRAVENOUS

## 2017-08-02 MED ORDER — SCOPOLAMINE 1 MG/3DAYS TD PT72
1.0000 | MEDICATED_PATCH | Freq: Once | TRANSDERMAL | Status: DC | PRN
Start: 2017-08-02 — End: 2017-08-02

## 2017-08-02 MED ORDER — DEXAMETHASONE SODIUM PHOSPHATE 10 MG/ML IJ SOLN
INTRAMUSCULAR | Status: DC | PRN
  Administered 2017-08-02: 10 mg via INTRAVENOUS

## 2017-08-02 MED ORDER — ONDANSETRON HCL 4 MG/2ML IJ SOLN
INTRAMUSCULAR | Status: DC | PRN
  Administered 2017-08-02: 4 mg via INTRAVENOUS

## 2017-08-02 MED ORDER — BUPIVACAINE HCL (PF) 0.25 % IJ SOLN
INTRAMUSCULAR | Status: AC
  Filled 2017-08-02: qty 30

## 2017-08-02 MED ORDER — ONDANSETRON HCL 4 MG/2ML IJ SOLN
INTRAMUSCULAR | Status: AC
  Filled 2017-08-02: qty 2

## 2017-08-02 MED ORDER — MIDAZOLAM HCL 2 MG/2ML IJ SOLN
INTRAMUSCULAR | Status: AC
  Filled 2017-08-02: qty 2

## 2017-08-02 MED ORDER — LACTATED RINGERS IV SOLN
INTRAVENOUS | Status: DC
  Administered 2017-08-02 (×2): via INTRAVENOUS

## 2017-08-02 MED ORDER — HYDROMORPHONE HCL 1 MG/ML IJ SOLN: 0.2500 mg | INTRAMUSCULAR | Status: DC | PRN

## 2017-08-02 SURGICAL SUPPLY — 50 items
BLADE MINI RND TIP GREEN BEAV (BLADE) IMPLANT
BLADE SURG 15 STRL LF DISP TIS (BLADE) ×2 IMPLANT
BLADE SURG 15 STRL SS (BLADE) ×3
BNDG CMPR 9X4 STRL LF SNTH (GAUZE/BANDAGES/DRESSINGS) ×2
BNDG COHESIVE 3X5 TAN STRL LF (GAUZE/BANDAGES/DRESSINGS) ×6 IMPLANT
BNDG ESMARK 4X9 LF (GAUZE/BANDAGES/DRESSINGS) ×3 IMPLANT
BNDG GAUZE ELAST 4 BULKY (GAUZE/BANDAGES/DRESSINGS) ×3 IMPLANT
CHLORAPREP W/TINT 26ML (MISCELLANEOUS) ×3 IMPLANT
CORD BIPOLAR FORCEPS 12FT (ELECTRODE) ×3 IMPLANT
COVER BACK TABLE 60X90IN (DRAPES) ×3 IMPLANT
COVER MAYO STAND STRL (DRAPES) ×3 IMPLANT
CUFF TOURN SGL LL 18 NRW (TOURNIQUET CUFF) ×3 IMPLANT
CUFF TOURNIQUET SINGLE 18IN (TOURNIQUET CUFF) ×2 IMPLANT
DECANTER SPIKE VIAL GLASS SM (MISCELLANEOUS) IMPLANT
DRAPE EXTREMITY T 121X128X90 (DRAPE) ×3 IMPLANT
DRAPE SURG 17X23 STRL (DRAPES) ×3 IMPLANT
DRSG PAD ABDOMINAL 8X10 ST (GAUZE/BANDAGES/DRESSINGS) ×4 IMPLANT
GAUZE SPONGE 4X4 12PLY STRL (GAUZE/BANDAGES/DRESSINGS) ×3 IMPLANT
GAUZE SPONGE 4X4 16PLY XRAY LF (GAUZE/BANDAGES/DRESSINGS) IMPLANT
GAUZE XEROFORM 1X8 LF (GAUZE/BANDAGES/DRESSINGS) ×3 IMPLANT
GLOVE BIO SURGEON STRL SZ7 (GLOVE) ×1 IMPLANT
GLOVE BIOGEL PI IND STRL 8.5 (GLOVE) ×2 IMPLANT
GLOVE BIOGEL PI INDICATOR 8.5 (GLOVE) ×1
GLOVE EXAM NITRILE MD LF STRL (GLOVE) ×1 IMPLANT
GLOVE SURG ORTHO 8.0 STRL STRW (GLOVE) ×3 IMPLANT
GOWN STRL REUS W/ TWL LRG LVL3 (GOWN DISPOSABLE) ×2 IMPLANT
GOWN STRL REUS W/ TWL XL LVL3 (GOWN DISPOSABLE) IMPLANT
GOWN STRL REUS W/TWL LRG LVL3 (GOWN DISPOSABLE)
GOWN STRL REUS W/TWL XL LVL3 (GOWN DISPOSABLE) ×6 IMPLANT
LOOP VESSEL MAXI BLUE (MISCELLANEOUS) IMPLANT
NDL PRECISIONGLIDE 27X1.5 (NEEDLE) IMPLANT
NEEDLE PRECISIONGLIDE 27X1.5 (NEEDLE) IMPLANT
NS IRRIG 1000ML POUR BTL (IV SOLUTION) ×3 IMPLANT
PACK BASIN DAY SURGERY FS (CUSTOM PROCEDURE TRAY) ×3 IMPLANT
PAD CAST 3X4 CTTN HI CHSV (CAST SUPPLIES) IMPLANT
PAD CAST 4YDX4 CTTN HI CHSV (CAST SUPPLIES) ×2 IMPLANT
PADDING CAST COTTON 3X4 STRL (CAST SUPPLIES)
PADDING CAST COTTON 4X4 STRL (CAST SUPPLIES) ×3
SLEEVE SCD COMPRESS KNEE MED (MISCELLANEOUS) ×3 IMPLANT
SPLINT PLASTER CAST XFAST 3X15 (CAST SUPPLIES) IMPLANT
SPLINT PLASTER XTRA FASTSET 3X (CAST SUPPLIES)
STOCKINETTE 4X48 STRL (DRAPES) ×3 IMPLANT
SUT ETHILON 4 0 PS 2 18 (SUTURE) ×3 IMPLANT
SUT VIC AB 2-0 SH 27 (SUTURE) ×3
SUT VIC AB 2-0 SH 27XBRD (SUTURE) ×2 IMPLANT
SUT VICRYL 4-0 PS2 18IN ABS (SUTURE) ×3 IMPLANT
SYR BULB 3OZ (MISCELLANEOUS) ×3 IMPLANT
SYR CONTROL 10ML LL (SYRINGE) IMPLANT
TOWEL OR 17X24 6PK STRL BLUE (TOWEL DISPOSABLE) ×3 IMPLANT
UNDERPAD 30X30 (UNDERPADS AND DIAPERS) ×3 IMPLANT

## 2017-08-02 NOTE — Progress Notes (Signed)
Assisted Dr. Rob Fitzgerald with right, ultrasound guided, supraclavicular block. Side rails up, monitors on throughout procedure. See vital signs in flow sheet. Tolerated Procedure well. 

## 2017-08-02 NOTE — H&P (Signed)
Alexander Robles is an 66 y.o. male.   Chief Complaint: numbness hands HPI: Alexander Robles is a 66 year old right-hand-dominant male referred by Dr. Deforest Hoyles for consultation regarding numbness and tingling right ring and small fingers. The this been going up past 6-7 months increasing over the past 2 weeks he is not complaining of cramping and numbness and tingling a relatively constant basis. Is not complaining of pain. He is complaining of his left side. He has no history of injury to his elbow neck or hand. He did play sports including wrestling and football when he was younger. He has tried ibuprofen without relief. He states cutting meat aggravates it at the end of the day. He is not complaining of increased cramping at that time. He has no history of diabetes thyroid problems arthritis gout. Family history is positive diabetes negative for thyroid problems arthritis gout. He has been told he is borderline.His nerve conductions are done by Dr. Thereasa Parkin revealing a bilateral carpal tunnel syndrome with no response to the sensory component greater than 6 ms delay on his right side motor component. He has changes in the ulnar nerves bilaterally. His ultrasound confirms this          Past Medical History:  Diagnosis Date  . Carpal tunnel syndrome   . Cubital tunnel syndrome on right   . Hypertension    states under control with meds., has been on med. x 5 yr.  . Skin disease, fungal    on back  . Wears partial dentures    upper    Past Surgical History:  Procedure Laterality Date  . COLONOSCOPY      History reviewed. No pertinent family history. Social History:  reports that he has never smoked. He has never used smokeless tobacco. He reports that he drinks alcohol. He reports that he does not use drugs.  Allergies: No Known Allergies  Medications Prior to Admission  Medication Sig Dispense Refill  . celecoxib (CELEBREX) 200 MG capsule Take 200 mg by mouth 2 (two) times daily.    [name]  doxazosin (CARDURA XL) 8 MG 24 hr tablet Take 8 mg by mouth daily with breakfast.    . GINSENG PO Take by mouth.    [name] ibuprofen (ADVIL,MOTRIN) 200 MG tablet Take 200 mg by mouth every 6 (six) hours as needed.    [name] losartan-hydrochlorothiazide (HYZAAR) 100-25 MG per tablet Take 1 tablet by mouth daily.    . Omega-3 Fatty Acids (FISH OIL) 1000 MG CAPS Take by mouth.    [name] OVER THE COUNTER MEDICATION Take by mouth daily. NUGENIX      Results for orders placed or performed during the hospital encounter of 08/02/17 (from the past 48 hour(s))  Basic metabolic panel     Status: Abnormal   Collection Time: 08/01/17 10:30 AM  Result Value Ref Range   Sodium 143 135 - 145 mmol/L   Potassium 4.0 3.5 - 5.1 mmol/L   Chloride 108 101 - 111 mmol/L   CO2 26 22 - 32 mmol/L   Glucose, Bld 111 (H) 65 - 99 mg/dL   BUN 13 6 - 20 mg/dL   Creatinine, Ser 0.90 0.61 - 1.24 mg/dL   Calcium 8.7 (L) 8.9 - 10.3 mg/dL   GFR calc non Af Amer >60 >60 mL/min   GFR calc Af Amer >60 >60 mL/min    Comment: (NOTE) The eGFR has been calculated using the CKD EPI equation. This calculation has not been validated in all clinical situations. eGFR's  persistently <60 mL/min signify possible Chronic Kidney Disease.    Anion gap 9 5 - 15    No results found.   Pertinent items are noted in HPI.  Blood pressure (!) 149/93, temperature 99.1 F (37.3 C), temperature source Oral, resp. rate 20, height _0  (1.727 m), weight 109.3 kg (241 lb), SpO2 95 %.  General appearance: alert, cooperative and appears stated age Head: Normocephalic, without obvious abnormality Neck: no JVD Resp: clear to auscultation bilaterally Cardio: regular rate and rhythm, S1, S2 normal, no murmur, click, rub or gallop GI: soft, non-tender; bowel sounds normal; no masses,  no organomegaly Extremities: numbness hands Pulses: 2+ and symmetric Skin: Skin color, texture, turgor normal. No rashes or lesions Neurologic: Grossly  normal Incision/Wound: na  Assessment/Plan Assessment:  1. 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 Entrapment of left ulnar nerve    Plan: We have discussed surgical release of the right side with him . Postoperative course are discussed along with risks and complications. He is where there is no guarantee to the surgery the possibility of infection recurrence injury to arteries nerves tendons incomplete relief symptoms dystrophy he is advised that we are attempting to halt the process where it is hopefully this will allow the nerve to get better questions are encouraged and answered. He is scheduled for carpal tunnel right side cubital tunnel with decompression possible transposition right side or nerve at the elbow.      Darden Flemister R 08/02/2017, 12:29 PM

## 2017-08-02 NOTE — Discharge Instructions (Addendum)

## 2017-08-02 NOTE — Transfer of Care (Signed)
Immediate Anesthesia Transfer of Care Note  Patient: Alexander Robles  Procedure(s) Performed: Procedure(s) with comments: RIGHT CARPAL TUNNEL RELEASE (Right) - AX BLOCK RIGHT ULNAR NERVE DECOMPRESSION (Right)  Patient Location: PACU  Anesthesia Type:General  Level of Consciousness: awake, alert  and oriented  Airway & Oxygen Therapy: Patient Spontanous Breathing and Patient connected to face mask oxygen  Post-op Assessment: Report given to RN  Post vital signs: Reviewed and stable  Last Vitals:  Vitals:   08/02/17 1255 08/02/17 1421  BP: 121/71 (!) 141/88  Pulse: 93 96  Resp: (!) 29 (!) 24  Temp:    SpO2: 98% 98%    Last Pain:  Vitals:   08/02/17 1255  TempSrc:   PainSc: 0-No pain         Complications: No apparent anesthesia complications

## 2017-08-02 NOTE — Anesthesia Procedure Notes (Signed)
Anesthesia Regional Block: Supraclavicular block   Pre-Anesthetic Checklist: ,, timeout performed, Correct Patient, Correct Site, Correct Laterality, Correct Procedure, Correct Position, site marked, Risks and benefits discussed,  Surgical consent,  Pre-op evaluation,  At surgeon's request and post-op pain management  Laterality: Right  Prep: chloraprep       Needles:  Injection technique: Single-shot  Needle Type: Echogenic Stimulator Needle     Needle Length: 9cm  Needle Gauge: 21     Additional Needles:   Procedures:, nerve stimulator,,, ultrasound used (permanent image in chart),,,,   Nerve Stimulator or Paresthesia:  Response: wrist and finger flextion and extension, biceps, 0.5 mA,   Additional Responses:   Narrative:  Start time: 08/02/2017 12:47 PM End time: 08/02/2017 12:54 PM Injection made incrementally with aspirations every 5 mL.  Performed by: Personally  Anesthesiologist: Suzette Battiest

## 2017-08-02 NOTE — Anesthesia Procedure Notes (Signed)
Procedure Name: LMA Insertion Performed by: Verita Lamb Pre-anesthesia Checklist: Patient identified, Emergency Drugs available, Suction available, Patient being monitored and Timeout performed Patient Re-evaluated:Patient Re-evaluated prior to induction Preoxygenation: Pre-oxygenation with 100% oxygen Induction Type: IV induction Ventilation: Mask ventilation without difficulty LMA: LMA inserted LMA Size: 4.0 Tube type: Oral Number of attempts: 1 Tube secured with: Tape Dental Injury: Teeth and Oropharynx as per pre-operative assessment  Comments: Inserted by Dr. Ola Spurr.  Pt has a very high anesthetic requirement requiring multiple boluses of propofol

## 2017-08-02 NOTE — Anesthesia Preprocedure Evaluation (Addendum)
Anesthesia Evaluation  Patient identified by MRN, date of birth, ID band Patient awake    Reviewed: Allergy & Precautions, NPO status , Patient's Chart, lab work & pertinent test results  Airway Mallampati: II  TM Distance: >3 FB Neck ROM: Full    Dental  (+) Dental Advisory Given   Pulmonary neg pulmonary ROS,    breath sounds clear to auscultation       Cardiovascular hypertension, Pt. on medications  Rhythm:Regular Rate:Normal     Neuro/Psych  Neuromuscular disease    GI/Hepatic negative GI ROS, Neg liver ROS,   Endo/Other  negative endocrine ROS  Renal/GU negative Renal ROS     Musculoskeletal   Abdominal   Peds  Hematology negative hematology ROS (+)   Anesthesia Other Findings   Reproductive/Obstetrics                            Anesthesia Physical Anesthesia Plan  ASA: II  Anesthesia Plan: General   Post-op Pain Management:  Regional for Post-op pain   Induction: Intravenous  PONV Risk Score and Plan: 2 and Ondansetron, Dexamethasone and Treatment may vary due to age or medical condition  Airway Management Planned: LMA  Additional Equipment:   Intra-op Plan:   Post-operative Plan: Extubation in OR  Informed Consent: I have reviewed the patients History and Physical, chart, labs and discussed the procedure including the risks, benefits and alternatives for the proposed anesthesia with the patient or authorized representative who has indicated his/her understanding and acceptance.   Dental advisory given  Plan Discussed with: CRNA  Anesthesia Plan Comments:         Anesthesia Quick Evaluation

## 2017-08-02 NOTE — Op Note (Signed)
Dictation Number [id number]

## 2017-08-02 NOTE — Brief Op Note (Signed)
08/02/2017  2:11 PM  PATIENT:  [name]  66 y.o. male  PRE-OPERATIVE DIAGNOSIS:  RIGHT CARPAL TUNNEL AND RIGHT CUBITAL TUNNEL SYNDROME  POST-OPERATIVE DIAGNOSIS:  RIGHT CARPAL TUNNEL AND RIGHT CUBITAL TUNNEL SYNDROME  PROCEDURE:  Procedure(s) with comments: RIGHT CARPAL TUNNEL RELEASE (Right) - AX BLOCK RIGHT ULNAR NERVE DECOMPRESSION (Right)  SURGEON:  Surgeon(s) and Role:    * Daryll Brod, MD - Primary  PHYSICIAN ASSISTANT:   ASSISTANTS: none   ANESTHESIA:   regional and general  EBL:  Total I/O In: 1000 [I.V.:1000] Out: -   BLOOD ADMINISTERED:none  DRAINS: none   LOCAL MEDICATIONS USED:  NONE  SPECIMEN:  No Specimen  DISPOSITION OF SPECIMEN:  N/A  COUNTS:  YES  TOURNIQUET:   Total Tourniquet Time Documented: Upper Arm (Right) - 35 minutes Total: Upper Arm (Right) - 35 minutes   DICTATION: .Other Dictation: Dictation Number [id number]  PLAN OF CARE: Discharge to home after PACU  PATIENT DISPOSITION:  PACU - hemodynamically stable.

## 2017-08-02 NOTE — Anesthesia Postprocedure Evaluation (Signed)
Anesthesia Post Note  Patient: Alexander Robles  Procedure(s) Performed: Procedure(s) (LRB): RIGHT CARPAL TUNNEL RELEASE (Right) RIGHT ULNAR NERVE DECOMPRESSION (Right)     Patient location during evaluation: PACU Anesthesia Type: General Level of consciousness: awake and alert Pain management: pain level controlled Vital Signs Assessment: post-procedure vital signs reviewed and stable Respiratory status: spontaneous breathing, nonlabored ventilation, respiratory function stable and patient connected to nasal cannula oxygen Cardiovascular status: blood pressure returned to baseline and stable Postop Assessment: no apparent nausea or vomiting Anesthetic complications: no    Last Vitals:  Vitals:   08/02/17 1445 08/02/17 1500  BP: (!) 141/87 140/88  Pulse: 92 98  Resp: (!) 25 (!) 21  Temp:    SpO2: 95% 94%    Last Pain:  Vitals:   08/02/17 1500  TempSrc:   PainSc: 0-No pain                 Tiajuana Amass

## 2017-08-02 NOTE — Op Note (Signed)
NAME:  Alexander Robles, Alexander Robles NO.:  [id number]  MEDICAL RECORD NO.:  [id number]  LOCATION:                                 FACILITY:  PHYSICIAN:  Daryll Brod, M.D.            DATE OF BIRTH:  DATE OF PROCEDURE:  08/02/2017 DATE OF DISCHARGE:                              OPERATIVE REPORT   PREOPERATIVE DIAGNOSIS:  Cubital tunnel, carpal tunnel syndrome, right arm.  POSTOPERATIVE DIAGNOSIS:  Cubital tunnel, carpal tunnel syndrome, right arm.  OPERATIONS:  Decompression of median nerve at the wrist, decompression of ulnar nerve at the elbow.  SURGEON:  Daryll Brod, M.D.  ANESTHESIA:  Supraclavicular block.  PLACE OF SURGERY:  [facility].  ANESTHESIOLOGISTS:  Suzette Battiest.  HISTORY:  The patient is a 66 year old male with a history of carpal tunnel syndrome, cubital tunnel syndrome bilaterally with numbness and tingling.  He has elected to undergo surgical release of this.  Nerve conductions are positive.  This has not responded to conservative treatment.  He is aware that there is no guarantee to the surgery, the possibility of infection, recurrence of injury to arteries, nerves, tendons, incomplete relief of symptoms, and dystrophy.  In the preoperative area, the patient is seen, the extremity marked by both patient and surgeon, antibiotic given.  DESCRIPTION OF PROCEDURE:  The patient was brought to the operating room after a supraclavicular block was carried out without difficulty under the direction Dr. Ola Spurr in the preoperative area.  He was prepped using ChloraPrep in a supine position with the right arm free.  A 3- minute dry time was allowed.  Time-out was taken confirming the patient and procedure.  The limb was exsanguinated with an Esmarch bandage. Tourniquet placed high on the arm was inflated to 250 mmHg.  The cubital tunnel was approached first.  An incision was made over the medial epicondyle of the right elbow, carried down  through subcutaneous tissue. Bleeders were electrocauterized with bipolar.  Posterior sensory branches of the medial antebrachial cutaneous nerve of the forearm were then identified and protected.  The nerve was significantly encased in scar.  This was identified proximally.  This allowed the Osborne fascia to be released through the cubital tunnel area.  This was done with sharp dissection.  The attention was attended proximally.  The brachial fascia was dissected free from the overlying brachial fascia separating the skin and subcutaneous tissue from the brachial fascia.  A knee retractor was placed.  A KMI guide for carpal tunnel release was then placed pressing the nerve posteriorly.  The brachial fascia was then released for approximately 5 cm proximal to the elbow medial epicondyle. Bleeders were electrocauterized as necessary.  Attention was then directed distally.  The flexor carpi ulnaris was split after placement of a knee retractors between skin and the fascia.  The muscle was then separated, and the deep fascia was then released distally after placement of a KMI guide to protect the ulnar nerve and using angled ENT scissors as was done proximally.  This was done for approximately 5-6 cm distally.  The elbow was fully flexed.  No subluxation to the nerve was apparent.  The wounds were irrigated with saline.  The Osborne fascia was then repaired to the posterior skin flap with 2-0 Vicryl sutures. Subcutaneous tissue was closed with interrupted 2-0 Vicryl and the skin with interrupted 4-0 nylon sutures.  A separate incision was then made in the right palm.  This was carried down through subcutaneous tissue. Bleeders were electrocauterized as necessary.  The palmar fascia was split.  The superficial palmar arch was identified.  The flexor tendon to the ring and little finger was identified.  The flexor retinaculum was then released on its ulnar border.  A right angle and  Sewall retractor were placed between skin and forearm fascia.  The overlying skin was dissected free from the distal forearm fascia.  The underlying nerve and tendons were separated, and the fascia was released for approximately 2-3 cm proximal to the wrist crease under direct vision with blunt scissors.  The canal was explored.  Area of compression to the nerve was apparent.  Motor branch entered into muscle distally.  The wound was copiously irrigated with saline and closed with interrupted 4- 0 nylon sutures.  A sterile compressive dressing to the elbow and wrist was applied.  On deflation of the tourniquet, all fingers immediately pinked.  He was taken to the recovery room for observation in satisfactory condition.  He will be discharged to home to return to the [facility] in 1 week, on [place].          ______________________________ Daryll Brod, M.D.     GK/MEDQ  D:  08/02/2017  T:  08/02/2017  Job:  [id number]

## 2017-08-05 ENCOUNTER — Encounter (HOSPITAL_BASED_OUTPATIENT_CLINIC_OR_DEPARTMENT_OTHER): Payer: Self-pay | Admitting: Orthopedic Surgery

## 2017-08-05 NOTE — Addendum Note (Signed)
Addendum  created 08/05/17 0914 by Tawni Millers, CRNA   Charge Capture section accepted

## 2018-01-24 DIAGNOSIS — E782 Mixed hyperlipidemia: Secondary | ICD-10-CM | POA: Diagnosis not present

## 2018-01-24 DIAGNOSIS — M179 Osteoarthritis of knee, unspecified: Secondary | ICD-10-CM | POA: Diagnosis not present

## 2018-01-24 DIAGNOSIS — M519 Unspecified thoracic, thoracolumbar and lumbosacral intervertebral disc disorder: Secondary | ICD-10-CM | POA: Diagnosis not present

## 2018-01-24 DIAGNOSIS — Z1389 Encounter for screening for other disorder: Secondary | ICD-10-CM | POA: Diagnosis not present

## 2018-01-24 DIAGNOSIS — R7303 Prediabetes: Secondary | ICD-10-CM | POA: Diagnosis not present

## 2018-01-24 DIAGNOSIS — I10 Essential (primary) hypertension: Secondary | ICD-10-CM | POA: Diagnosis not present

## 2018-02-21 DIAGNOSIS — E669 Obesity, unspecified: Secondary | ICD-10-CM | POA: Diagnosis not present

## 2018-02-21 DIAGNOSIS — Z6838 Body mass index (BMI) 38.0-38.9, adult: Secondary | ICD-10-CM | POA: Diagnosis not present

## 2018-02-21 DIAGNOSIS — Z86010 Personal history of colonic polyps: Secondary | ICD-10-CM | POA: Diagnosis not present

## 2018-04-18 DIAGNOSIS — D126 Benign neoplasm of colon, unspecified: Secondary | ICD-10-CM | POA: Diagnosis not present

## 2018-04-18 DIAGNOSIS — K648 Other hemorrhoids: Secondary | ICD-10-CM | POA: Diagnosis not present

## 2018-04-18 DIAGNOSIS — K5730 Diverticulosis of large intestine without perforation or abscess without bleeding: Secondary | ICD-10-CM | POA: Diagnosis not present

## 2018-04-18 DIAGNOSIS — Z86010 Personal history of colonic polyps: Secondary | ICD-10-CM | POA: Diagnosis not present

## 2018-04-22 DIAGNOSIS — D126 Benign neoplasm of colon, unspecified: Secondary | ICD-10-CM | POA: Diagnosis not present

## 2018-07-31 DIAGNOSIS — N400 Enlarged prostate without lower urinary tract symptoms: Secondary | ICD-10-CM | POA: Diagnosis not present

## 2018-07-31 DIAGNOSIS — E782 Mixed hyperlipidemia: Secondary | ICD-10-CM | POA: Diagnosis not present

## 2018-07-31 DIAGNOSIS — R7303 Prediabetes: Secondary | ICD-10-CM | POA: Diagnosis not present

## 2018-07-31 DIAGNOSIS — Z23 Encounter for immunization: Secondary | ICD-10-CM | POA: Diagnosis not present

## 2018-07-31 DIAGNOSIS — I10 Essential (primary) hypertension: Secondary | ICD-10-CM | POA: Diagnosis not present

## 2018-07-31 DIAGNOSIS — Z0000 Encounter for general adult medical examination without abnormal findings: Secondary | ICD-10-CM | POA: Diagnosis not present

## 2018-07-31 DIAGNOSIS — Z1389 Encounter for screening for other disorder: Secondary | ICD-10-CM | POA: Diagnosis not present

## 2018-07-31 DIAGNOSIS — J309 Allergic rhinitis, unspecified: Secondary | ICD-10-CM | POA: Diagnosis not present

## 2018-07-31 DIAGNOSIS — M179 Osteoarthritis of knee, unspecified: Secondary | ICD-10-CM | POA: Diagnosis not present

## 2018-07-31 DIAGNOSIS — R21 Rash and other nonspecific skin eruption: Secondary | ICD-10-CM | POA: Diagnosis not present

## 2018-07-31 DIAGNOSIS — M519 Unspecified thoracic, thoracolumbar and lumbosacral intervertebral disc disorder: Secondary | ICD-10-CM | POA: Diagnosis not present

## 2018-08-01 ENCOUNTER — Other Ambulatory Visit: Payer: Self-pay | Admitting: Internal Medicine

## 2018-08-01 DIAGNOSIS — N289 Disorder of kidney and ureter, unspecified: Secondary | ICD-10-CM

## 2018-08-04 ENCOUNTER — Ambulatory Visit
Admission: RE | Admit: 2018-08-04 | Discharge: 2018-08-04 | Disposition: A | Discharge status: Home / Self Care | Payer: Medicare Other | Source: Ambulatory Visit | Attending: Internal Medicine | Admitting: Internal Medicine

## 2018-08-04 DIAGNOSIS — N2889 Other specified disorders of kidney and ureter: Secondary | ICD-10-CM | POA: Diagnosis not present

## 2018-08-04 DIAGNOSIS — N289 Disorder of kidney and ureter, unspecified: Secondary | ICD-10-CM

## 2018-08-15 DIAGNOSIS — I10 Essential (primary) hypertension: Secondary | ICD-10-CM | POA: Diagnosis not present

## 2018-08-18 DIAGNOSIS — I10 Essential (primary) hypertension: Secondary | ICD-10-CM | POA: Diagnosis not present

## 2018-08-18 DIAGNOSIS — M519 Unspecified thoracic, thoracolumbar and lumbosacral intervertebral disc disorder: Secondary | ICD-10-CM | POA: Diagnosis not present

## 2018-10-29 IMAGING — US US RENAL
1 series · 14 of 25 positions shown · non-contrast
Comparison: None.

CLINICAL DATA: Decreased renal function

EXAM:
RENAL / URINARY TRACT ULTRASOUND COMPLETE

[Series 1: us renal · 0.26mm/px · 14 of 37 slices shown]
[im 1/37]
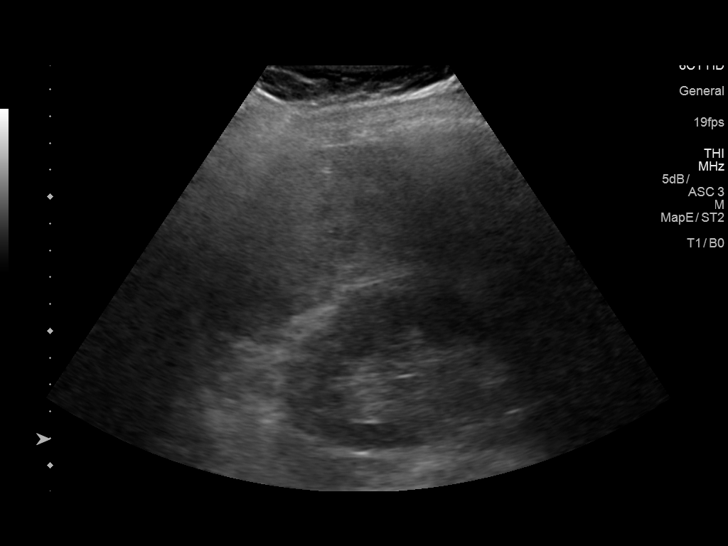
[im 4/37]
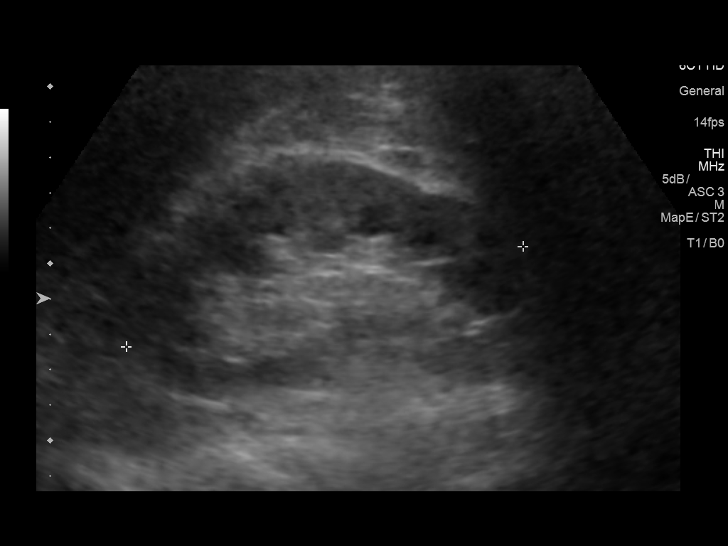
[im 7/37]
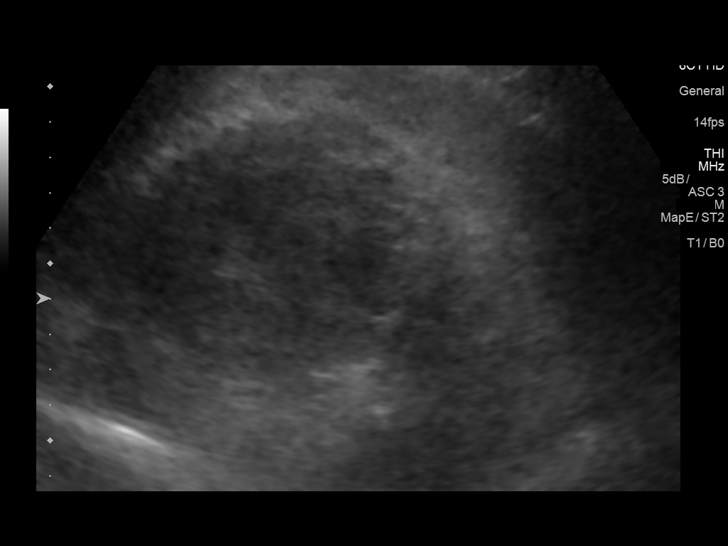
[im 10/37]
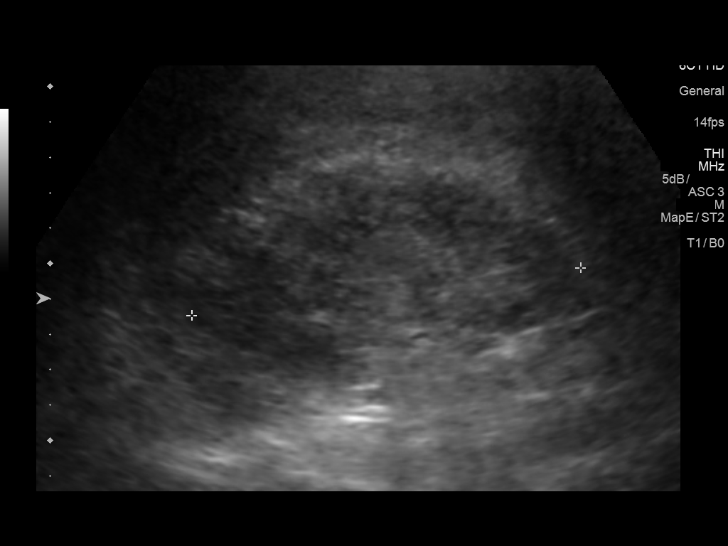
[im 13/37]
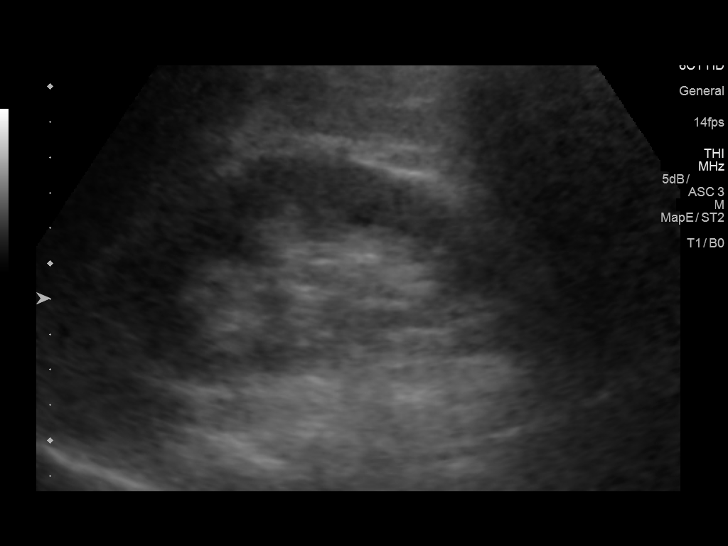
[im 14/37]
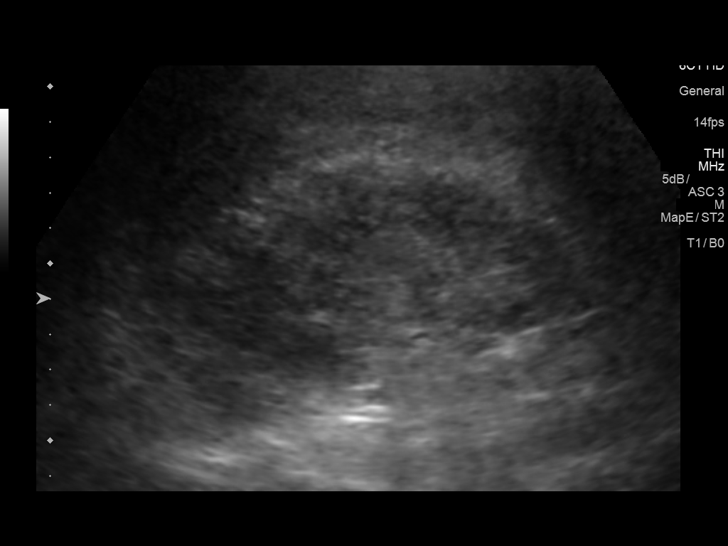
[im 17/37]
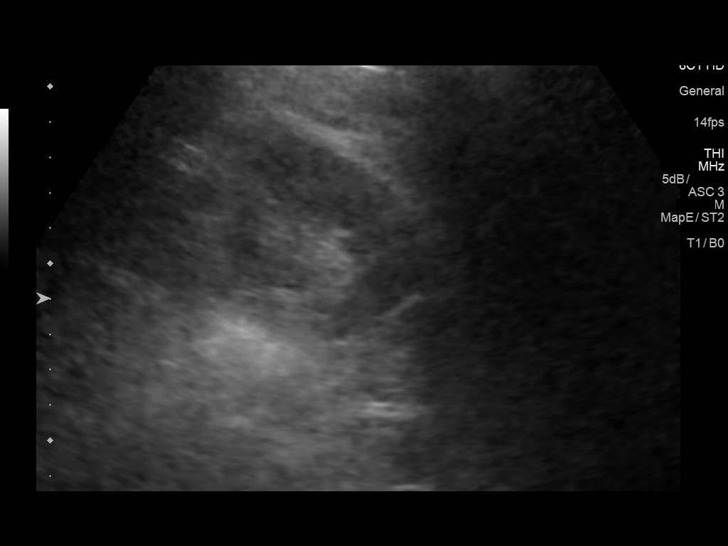
[im 20/37]
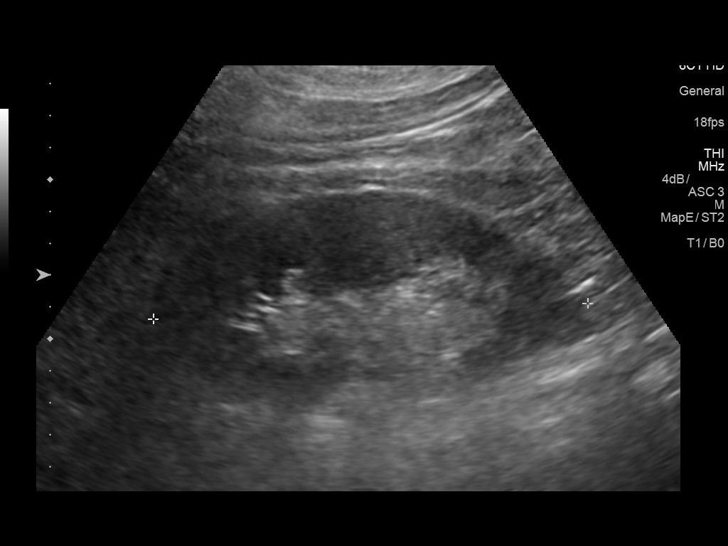
[im 23/37]
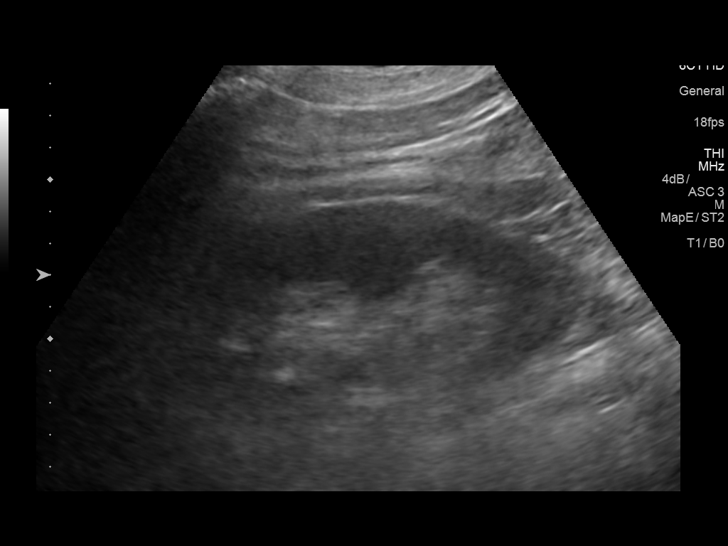
[im 25/37]
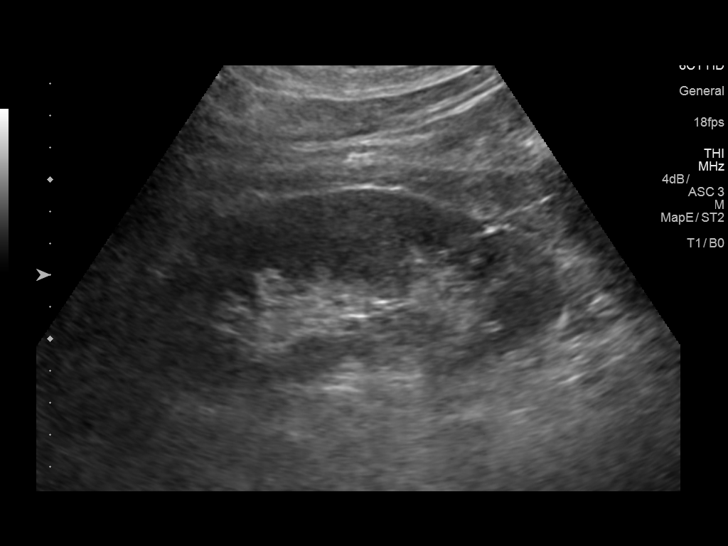
[im 28/37]
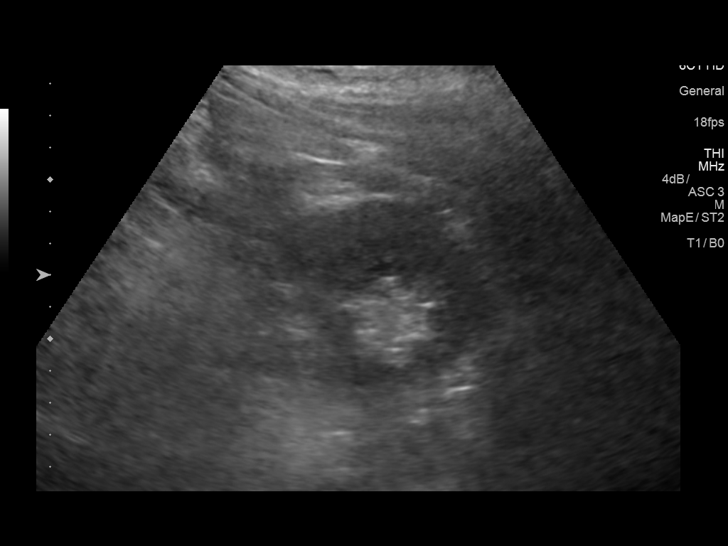
[im 31/37]
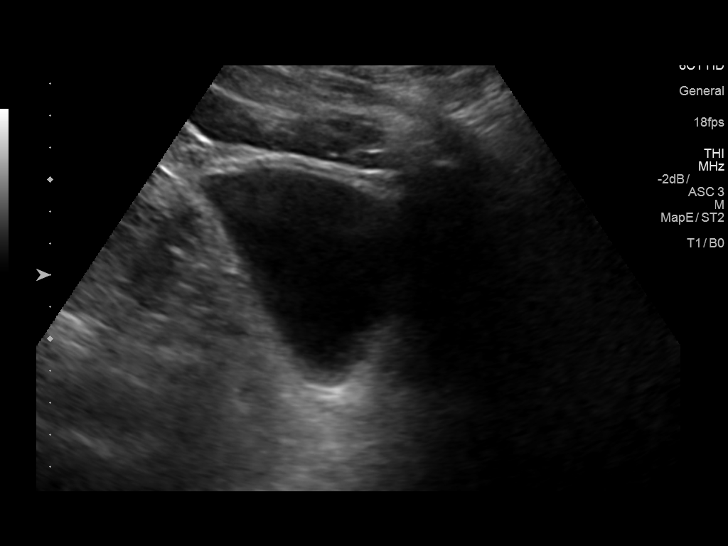
[im 34/37]
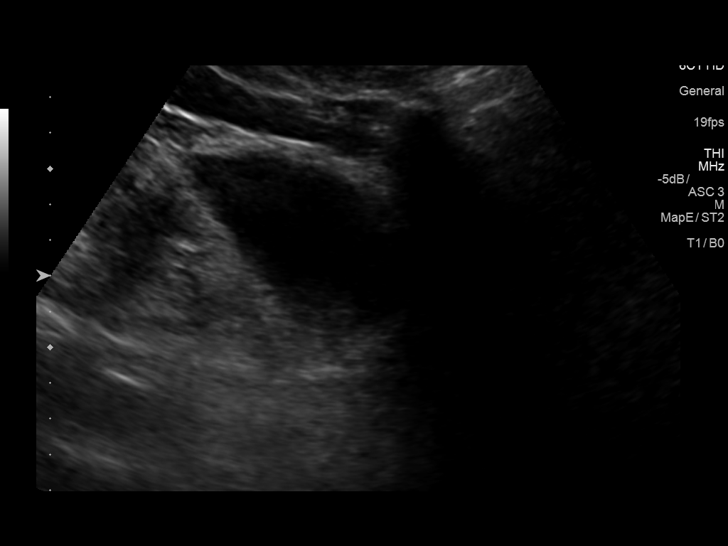
[im 37/37]
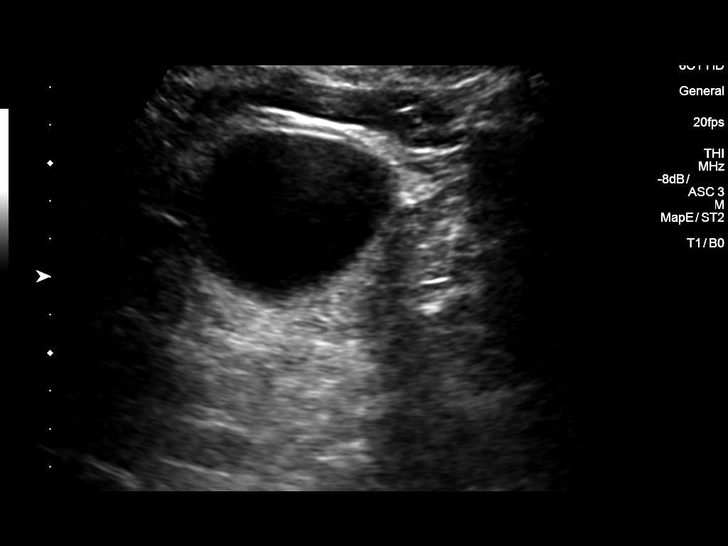

[14 of 25 positions shown; findings below may reference images not displayed]

FINDINGS: Right Kidney:

Length: 11.6. Echogenicity within normal limits. No mass or
hydronephrosis visualized.

Left Kidney:

Length: 13.7. Echogenicity within normal limits. No mass or
hydronephrosis visualized.

Bladder:

Appears normal for degree of bladder distention.
IMPRESSION: Normal renal ultrasound.

## 2018-12-05 DIAGNOSIS — M519 Unspecified thoracic, thoracolumbar and lumbosacral intervertebral disc disorder: Secondary | ICD-10-CM | POA: Diagnosis not present

## 2018-12-05 DIAGNOSIS — I10 Essential (primary) hypertension: Secondary | ICD-10-CM | POA: Diagnosis not present

## 2018-12-05 DIAGNOSIS — M179 Osteoarthritis of knee, unspecified: Secondary | ICD-10-CM | POA: Diagnosis not present

## 2018-12-05 DIAGNOSIS — F419 Anxiety disorder, unspecified: Secondary | ICD-10-CM | POA: Diagnosis not present

## 2018-12-05 DIAGNOSIS — Z6835 Body mass index (BMI) 35.0-35.9, adult: Secondary | ICD-10-CM | POA: Diagnosis not present

## 2018-12-05 DIAGNOSIS — R7303 Prediabetes: Secondary | ICD-10-CM | POA: Diagnosis not present

## 2018-12-05 DIAGNOSIS — N529 Male erectile dysfunction, unspecified: Secondary | ICD-10-CM | POA: Diagnosis not present

## 2019-06-26 DIAGNOSIS — M179 Osteoarthritis of knee, unspecified: Secondary | ICD-10-CM | POA: Diagnosis not present

## 2019-06-26 DIAGNOSIS — M519 Unspecified thoracic, thoracolumbar and lumbosacral intervertebral disc disorder: Secondary | ICD-10-CM | POA: Diagnosis not present

## 2019-06-26 DIAGNOSIS — I10 Essential (primary) hypertension: Secondary | ICD-10-CM | POA: Diagnosis not present

## 2019-06-26 DIAGNOSIS — N400 Enlarged prostate without lower urinary tract symptoms: Secondary | ICD-10-CM | POA: Diagnosis not present

## 2019-06-26 DIAGNOSIS — E782 Mixed hyperlipidemia: Secondary | ICD-10-CM | POA: Diagnosis not present

## 2019-06-26 DIAGNOSIS — R7303 Prediabetes: Secondary | ICD-10-CM | POA: Diagnosis not present

## 2019-07-13 DIAGNOSIS — N400 Enlarged prostate without lower urinary tract symptoms: Secondary | ICD-10-CM | POA: Diagnosis not present

## 2019-07-13 DIAGNOSIS — M179 Osteoarthritis of knee, unspecified: Secondary | ICD-10-CM | POA: Diagnosis not present

## 2019-07-13 DIAGNOSIS — I10 Essential (primary) hypertension: Secondary | ICD-10-CM | POA: Diagnosis not present

## 2019-07-13 DIAGNOSIS — E782 Mixed hyperlipidemia: Secondary | ICD-10-CM | POA: Diagnosis not present

## 2021-05-23 ENCOUNTER — Emergency Department (HOSPITAL_COMMUNITY): Payer: Medicare Other

## 2021-05-23 ENCOUNTER — Other Ambulatory Visit: Payer: Self-pay

## 2021-05-23 ENCOUNTER — Encounter (HOSPITAL_COMMUNITY): Payer: Self-pay | Admitting: Emergency Medicine

## 2021-05-23 ENCOUNTER — Ambulatory Visit (HOSPITAL_COMMUNITY)
Admission: EM | Admit: 2021-05-23 | Discharge: 2021-05-23 | Disposition: A | Discharge status: Home / Self Care | Payer: Medicare Other

## 2021-05-23 ENCOUNTER — Inpatient Hospital Stay (HOSPITAL_COMMUNITY)
Admission: EM | Admit: 2021-05-23 | Discharge: 2021-05-29 | DRG: 175 | Disposition: A | Discharge status: Home Health | Payer: Medicare Other | Attending: Internal Medicine | Admitting: Internal Medicine

## 2021-05-23 DIAGNOSIS — I5033 Acute on chronic diastolic (congestive) heart failure: Secondary | ICD-10-CM | POA: Diagnosis not present

## 2021-05-23 DIAGNOSIS — I2692 Saddle embolus of pulmonary artery without acute cor pulmonale: Principal | ICD-10-CM | POA: Diagnosis present

## 2021-05-23 DIAGNOSIS — R0682 Tachypnea, not elsewhere classified: Secondary | ICD-10-CM | POA: Diagnosis not present

## 2021-05-23 DIAGNOSIS — I517 Cardiomegaly: Secondary | ICD-10-CM | POA: Diagnosis present

## 2021-05-23 DIAGNOSIS — R600 Localized edema: Secondary | ICD-10-CM

## 2021-05-23 DIAGNOSIS — E785 Hyperlipidemia, unspecified: Secondary | ICD-10-CM | POA: Diagnosis present

## 2021-05-23 DIAGNOSIS — Z20822 Contact with and (suspected) exposure to covid-19: Secondary | ICD-10-CM | POA: Diagnosis present

## 2021-05-23 DIAGNOSIS — I110 Hypertensive heart disease with heart failure: Secondary | ICD-10-CM | POA: Diagnosis not present

## 2021-05-23 DIAGNOSIS — R0602 Shortness of breath: Secondary | ICD-10-CM

## 2021-05-23 DIAGNOSIS — I2781 Cor pulmonale (chronic): Secondary | ICD-10-CM | POA: Diagnosis present

## 2021-05-23 DIAGNOSIS — Z6836 Body mass index (BMI) 36.0-36.9, adult: Secondary | ICD-10-CM

## 2021-05-23 DIAGNOSIS — R059 Cough, unspecified: Secondary | ICD-10-CM | POA: Diagnosis present

## 2021-05-23 DIAGNOSIS — I82441 Acute embolism and thrombosis of right tibial vein: Secondary | ICD-10-CM | POA: Diagnosis present

## 2021-05-23 DIAGNOSIS — I509 Heart failure, unspecified: Secondary | ICD-10-CM

## 2021-05-23 DIAGNOSIS — I82411 Acute embolism and thrombosis of right femoral vein: Secondary | ICD-10-CM | POA: Diagnosis present

## 2021-05-23 DIAGNOSIS — I5031 Acute diastolic (congestive) heart failure: Secondary | ICD-10-CM | POA: Diagnosis not present

## 2021-05-23 DIAGNOSIS — J9601 Acute respiratory failure with hypoxia: Secondary | ICD-10-CM | POA: Diagnosis present

## 2021-05-23 DIAGNOSIS — Z833 Family history of diabetes mellitus: Secondary | ICD-10-CM

## 2021-05-23 DIAGNOSIS — I82431 Acute embolism and thrombosis of right popliteal vein: Secondary | ICD-10-CM | POA: Diagnosis present

## 2021-05-23 DIAGNOSIS — M1990 Unspecified osteoarthritis, unspecified site: Secondary | ICD-10-CM | POA: Diagnosis present

## 2021-05-23 DIAGNOSIS — I700 Atherosclerosis of aorta: Secondary | ICD-10-CM | POA: Diagnosis present

## 2021-05-23 DIAGNOSIS — E669 Obesity, unspecified: Secondary | ICD-10-CM | POA: Diagnosis present

## 2021-05-23 DIAGNOSIS — Z79899 Other long term (current) drug therapy: Secondary | ICD-10-CM

## 2021-05-23 DIAGNOSIS — G8929 Other chronic pain: Secondary | ICD-10-CM | POA: Diagnosis present

## 2021-05-23 DIAGNOSIS — R9431 Abnormal electrocardiogram [ECG] [EKG]: Secondary | ICD-10-CM | POA: Diagnosis present

## 2021-05-23 DIAGNOSIS — E876 Hypokalemia: Secondary | ICD-10-CM | POA: Diagnosis present

## 2021-05-23 DIAGNOSIS — J811 Chronic pulmonary edema: Secondary | ICD-10-CM | POA: Diagnosis not present

## 2021-05-23 DIAGNOSIS — R091 Pleurisy: Secondary | ICD-10-CM | POA: Diagnosis present

## 2021-05-23 LAB — CBC WITH DIFFERENTIAL/PLATELET
Abs Immature Granulocytes: 0.04 10*3/uL (ref 0.00–0.07)
Basophils Absolute: 0.1 10*3/uL (ref 0.0–0.1)
Basophils Relative: 1 %
Eosinophils Absolute: 0.1 10*3/uL (ref 0.0–0.5)
Eosinophils Relative: 1 %
HCT: 51.6 % (ref 39.0–52.0)
Hemoglobin: 16.6 g/dL (ref 13.0–17.0)
Immature Granulocytes: 0 %
Lymphocytes Relative: 12 %
Lymphs Abs: 1.2 10*3/uL (ref 0.7–4.0)
MCH: 32.2 pg (ref 26.0–34.0)
MCHC: 32.2 g/dL (ref 30.0–36.0)
MCV: 100 fL (ref 80.0–100.0)
Monocytes Absolute: 0.9 10*3/uL (ref 0.1–1.0)
Monocytes Relative: 9 %
Neutro Abs: 8.2 10*3/uL — ABNORMAL HIGH (ref 1.7–7.7)
Neutrophils Relative %: 77 %
Platelets: 457 10*3/uL — ABNORMAL HIGH (ref 150–400)
RBC: 5.16 MIL/uL (ref 4.22–5.81)
RDW: 14.4 % (ref 11.5–15.5)
WBC: 10.5 10*3/uL (ref 4.0–10.5)
nRBC: 0 % (ref 0.0–0.2)

## 2021-05-23 LAB — COMPREHENSIVE METABOLIC PANEL
ALT: 24 U/L (ref 0–44)
AST: 25 U/L (ref 15–41)
Albumin: 3.3 g/dL — ABNORMAL LOW (ref 3.5–5.0)
Alkaline Phosphatase: 80 U/L (ref 38–126)
Anion gap: 11 (ref 5–15)
BUN: 10 mg/dL (ref 8–23)
CO2: 33 mmol/L — ABNORMAL HIGH (ref 22–32)
Calcium: 8.8 mg/dL — ABNORMAL LOW (ref 8.9–10.3)
Chloride: 92 mmol/L — ABNORMAL LOW (ref 98–111)
Creatinine, Ser: 0.95 mg/dL (ref 0.61–1.24)
GFR, Estimated: >60 mL/min (ref >60)
Glucose, Bld: 111 mg/dL — ABNORMAL HIGH (ref 70–99)
Potassium: 3.3 mmol/L — ABNORMAL LOW (ref 3.5–5.1)
Sodium: 136 mmol/L (ref 135–145)
Total Bilirubin: 1.4 mg/dL — ABNORMAL HIGH (ref 0.3–1.2)
Total Protein: 7.1 g/dL (ref 6.5–8.1)

## 2021-05-23 LAB — LACTIC ACID, PLASMA: Lactic Acid, Venous: 1.8 mmol/L (ref 0.5–1.9)

## 2021-05-23 LAB — TROPONIN I (HIGH SENSITIVITY): Troponin I (High Sensitivity): 9 ng/L (ref <18)

## 2021-05-23 LAB — BRAIN NATRIURETIC PEPTIDE: B Natriuretic Peptide: 614 pg/mL — ABNORMAL HIGH (ref 0.0–100.0)

## 2021-05-23 MED ORDER — FUROSEMIDE 10 MG/ML IJ SOLN
40.0000 mg | INTRAMUSCULAR | Status: AC
  Administered 2021-05-23: 40 mg via INTRAVENOUS
  Filled 2021-05-23: qty 4

## 2021-05-23 NOTE — ED Provider Notes (Signed)
Emergency Medicine Provider Triage Evaluation Note  Alexander Robles , a 70 y.o. male  was evaluated in triage.  Pt complains of patient presents with shortness of breath and a cough, she has been going on for the last week, states is productive, has no associated fevers, chills, chest pain, general body aches.  He does endorse that his legs have been swelling, he states he has been on for last month, has no history of PEs or DVTs.  No cardiac history, patient is currently requiring supplemental oxygen he does not use this at home.  He thinks he might have pneumonia..  Review of Systems  Positive: Cough, shortness of breath Negative: Abdominal pain nausea vomiting  Physical Exam  BP (!) 114/96 (BP Location: Left Arm)   Pulse 93   Temp 98.2 F (36.8 C) (Oral)   Resp (!) 24   Ht 5\' 8"  (1.727 m)   Wt 109.3 kg   SpO2 92%   BMI 36.64 kg/m  Gen:   Awake, no distress   Resp:  Normal effort decreased breath sounds left lower lobe MSK:   Moves extremities without difficulty  Other:    Medical Decision Making  Medically screening exam initiated at 8:51 PM.  Appropriate orders placed.  [name] was informed that the remainder of the evaluation will be completed by another provider, this initial triage assessment does not replace that evaluation, and the importance of remaining in the ED until their evaluation is complete.  Patient presents with cough shortness of breath, lab work imaging have been ordered, patient need further work-up here in the emergency department.   Marcello Fennel, PA-C 05/23/21 2052    Deno Etienne, DO 05/23/21 2325

## 2021-05-23 NOTE — ED Notes (Addendum)
Patient is being discharged from the Urgent Care and sent to the Emergency Department via pov . Per rachel, pa, patient is in need of higher level of care due to abnormal vitals, sob, . Patient is aware and verbalizes understanding of plan of care. Pulse ox on room air 91% Vitals:   05/23/21 1856  BP: 127/85  Pulse: 94  Resp: (!) 32  Temp: 97.8 F (36.6 C)

## 2021-05-23 NOTE — ED Triage Notes (Addendum)
Pt c/o worsening shortness of breath for the past week. States he stopped drinking about 3 weeks ago hoping for improvent. Presents to ED with labored breathing, bilateral leg swelling, and requiring 3 L of oxygen at 90%. Was 84% on room air.

## 2021-05-23 NOTE — ED Provider Notes (Signed)
[facility] EMERGENCY DEPARTMENT Provider Note   CSN: [id number] Arrival date & time: 05/23/21  1923     History Chief Complaint  Patient presents with   Shortness of Breath   Hypoxia    Alexander Robles is a 70 y.o. male.  The history is provided by the patient and medical records.  Shortness of Breath Associated symptoms: cough    70 y.o. M with hx of HTN, presenting to the ED for LE swelling and SOB.  States leg swelling has been ongoing for about a week now, started getting SOB 3 days ago but has progressively worsened.  Reports constant urge to cough but no production of mucous, no fever, chills, sweats, body aches.  No sick contacts or covid exposures.  States all of this began about 3 weeks ago when he stopped drinking.  States he is retired and does not drink to the point where he "blacks out" but he does "have a heavy pour.  He denies any tremors or seizure activity since he stopped drinking.  Past Medical History:  Diagnosis Date   Carpal tunnel syndrome    Cubital tunnel syndrome on right    Hypertension    states under control with meds., has been on med. x 5 yr.   Skin disease, fungal    on back   Wears partial dentures    upper    There are no problems to display for this patient.   Past Surgical History:  Procedure Laterality Date   CARPAL TUNNEL RELEASE Right 08/02/2017   Procedure: RIGHT CARPAL TUNNEL RELEASE;  Surgeon: Daryll Brod, MD;  Location: [place];  Service: Orthopedics;  Laterality: Right;  AX BLOCK   COLONOSCOPY     ULNAR NERVE TRANSPOSITION Right 08/02/2017   Procedure: RIGHT ULNAR NERVE DECOMPRESSION;  Surgeon: Daryll Brod, MD;  Location: [place];  Service: Orthopedics;  Laterality: Right;       No family history on file.  Social History   Tobacco Use   Smoking status: Never   Smokeless tobacco: Never  Vaping Use   Vaping Use: Never used  Substance Use Topics   Alcohol use: Not  Currently    Comment: 7 drinks/week.  05/23/2021-states he stopped drinking 2 weeks ago   Drug use: No    Home Medications Prior to Admission medications   Medication Sig Start Date End Date Taking? Authorizing Provider  celecoxib (CELEBREX) 200 MG capsule Take 200 mg by mouth 2 (two) times daily. Patient not taking: Reported on 05/23/2021    [provider]  doxazosin (CARDURA XL) 8 MG 24 hr tablet Take 8 mg by mouth daily with breakfast. Patient not taking: Reported on 05/23/2021    [provider]  GINSENG PO Take by mouth. Patient not taking: Reported on 05/23/2021    [provider]  HYDROcodone-acetaminophen (NORCO) 10-325 MG tablet Take 1 tablet by mouth every 6 (six) hours as needed. Patient not taking: Reported on 05/23/2021 08/02/17   Daryll Brod, MD  ibuprofen (ADVIL,MOTRIN) 200 MG tablet Take 200 mg by mouth every 6 (six) hours as needed.    [provider]  losartan-hydrochlorothiazide (HYZAAR) 100-25 MG per tablet Take 1 tablet by mouth daily.    [provider]  Omega-3 Fatty Acids (FISH OIL) 1000 MG CAPS Take by mouth. Patient not taking: Reported on 05/23/2021    [provider]  OVER THE COUNTER MEDICATION Take by mouth daily. [place] Patient not taking: Reported  on 05/23/2021    [provider]    Allergies    Patient has no known allergies.  Review of Systems   Review of Systems  Respiratory:  Positive for cough and shortness of breath.   Cardiovascular:  Positive for leg swelling.  All other systems reviewed and are negative.  Physical Exam Updated Vital Signs BP (!) 114/96 (BP Location: Left Arm)   Pulse 93   Temp 98.2 F (36.8 C) (Oral)   Resp (!) 24   Ht 5\' 8"  (1.727 m)   Wt 109.3 kg   SpO2 92%   BMI 36.64 kg/m   Physical Exam Vitals and nursing note reviewed.  Constitutional:      Appearance: He is well-developed.  HENT:     Head: Normocephalic and atraumatic.  Eyes:      Conjunctiva/sclera: Conjunctivae normal.     Pupils: Pupils are equal, round, and reactive to light.  Cardiovascular:     Rate and Rhythm: Normal rate and regular rhythm.     Heart sounds: Normal heart sounds.  Pulmonary:     Effort: Pulmonary effort is normal.     Breath sounds: Normal breath sounds. No wheezing or rhonchi.     Comments: Continuous dry, hacking cough, O2 sats 92-93% on 3L Abdominal:     General: Bowel sounds are normal.     Palpations: Abdomen is soft.  Musculoskeletal:        General: Normal range of motion.     Cervical back: Normal range of motion.     Comments: 1+ pitting edema BLE  Skin:    General: Skin is warm and dry.  Neurological:     Mental Status: He is alert and oriented to person, place, and time.    ED Results / Procedures / Treatments   Labs (all labs ordered are listed, but only abnormal results are displayed) Labs Reviewed  COMPREHENSIVE METABOLIC PANEL - Abnormal; Notable for the following components:      Result Value   Potassium 3.3 (*)    Chloride 92 (*)    CO2 33 (*)    Glucose, Bld 111 (*)    Calcium 8.8 (*)    Albumin 3.3 (*)    Total Bilirubin 1.4 (*)    All other components within normal limits  BRAIN NATRIURETIC PEPTIDE - Abnormal; Notable for the following components:   B Natriuretic Peptide 614.0 (*)    All other components within normal limits  CBC WITH DIFFERENTIAL/PLATELET - Abnormal; Notable for the following components:   Platelets 457 (*)    Neutro Abs 8.2 (*)    All other components within normal limits  RESP PANEL BY RT-PCR (FLU A&B, COVID) ARPGX2  CULTURE, BLOOD (ROUTINE X 2)  CULTURE, BLOOD (ROUTINE X 2)  LACTIC ACID, PLASMA  ETHANOL  RAPID URINE DRUG SCREEN, HOSP PERFORMED  TROPONIN I (HIGH SENSITIVITY)  TROPONIN I (HIGH SENSITIVITY)    EKG None  Radiology DG Chest 2 View  Result Date: 05/23/2021 CLINICAL DATA:  Shortness of breath which increases movement for 1 week, cough for 1 week, bilateral  ankle swelling EXAM: CHEST - 2 VIEW COMPARISON:  Radiograph 08/25/2020 FINDINGS: Hazy perihilar and basilar predominant opacities present in both lungs. Pulmonary vascular congestion and redistribution. Prominent cardiac silhouette, may be accentuated by AP technique. No visible layering effusion or discernible pneumothorax. No acute osseous or soft tissue abnormality. Degenerative changes are present in the imaged spine and shoulders. IMPRESSION: Hazy perihilar and basilar opacities, may reflect atelectasis  or developing pulmonary edema with additional features which could suggest some degree heart failure or volume overload including vascular congestion and enlarged cardiac silhouette. Electronically Signed   By: Lovena Le M.D.   On: 05/23/2021 22:02    Procedures Procedures   CRITICAL CARE Performed by: Larene Pickett   Total critical care time: 35 minutes  Critical care time was exclusive of separately billable procedures and treating other patients.  Critical care was necessary to treat or prevent imminent or life-threatening deterioration.  Critical care was time spent personally by me on the following activities: development of treatment plan with patient and/or surrogate as well as nursing, discussions with consultants, evaluation of patient's response to treatment, examination of patient, obtaining history from patient or surrogate, ordering and performing treatments and interventions, ordering and review of laboratory studies, ordering and review of radiographic studies, pulse oximetry and re-evaluation of patient's condition.   Medications Ordered in ED Medications  furosemide (LASIX) injection 40 mg (40 mg Intravenous Given 05/23/21 2318)    ED Course  I have reviewed the triage vital signs and the nursing notes.  Pertinent labs & imaging results that were available during my care of the patient were reviewed by me and considered in my medical decision making (see chart for  details).    MDM Rules/Calculators/A&P  70 y.o. M here with LE edema for the past week and worsening SOB x 3 days.  Reports he quite drinking 3 weeks ago and symptoms came on after that.  He denies any tremors or seizure activity since he quit drinking.   On exam he is afebrile, non-toxic, but does appear SOB.  He has 1+ pitting edema of BLE and has consistent dry cough throughout exam.  Currently on 3L supplemental O2 and sats 92-93%.    Work-up is concerning for new onset CHF which correlates clinically.  BNP 600+, trop negative.  CBC and CMP largely reassuring.  He is given IV lasix.  Given new O2 requirement, he will require admission for ongoing management.  Discussed with hospitalist, Dr. Nevada Crane-- will admit for ongoing care.  Final Clinical Impression(s) / ED Diagnoses Final diagnoses:  Acute diastolic congestive heart failure ([place])  SOB (shortness of breath)    Rx / DC Orders ED Discharge Orders     None        Larene Pickett, PA-C 05/24/21 [id number]    Davonna Belling, MD 05/24/21 [phone]

## 2021-05-23 NOTE — ED Triage Notes (Signed)
Sob increases with movement x 1 week Patient reports a cough for one week.  Bilateral ankle swelling for a month.    Patient stopped drinking alcohol 2 weeks ago.  Denies chest pain.  Back pain with coughing , feels like when he had "walking pneumonia".

## 2021-05-23 NOTE — ED Notes (Signed)
Spoke to [employer], pa

## 2021-05-24 ENCOUNTER — Inpatient Hospital Stay (HOSPITAL_COMMUNITY): Payer: Medicare Other

## 2021-05-24 ENCOUNTER — Other Ambulatory Visit: Payer: Self-pay

## 2021-05-24 ENCOUNTER — Other Ambulatory Visit (HOSPITAL_COMMUNITY): Payer: Medicare Other

## 2021-05-24 ENCOUNTER — Encounter (HOSPITAL_COMMUNITY): Payer: Self-pay | Admitting: Internal Medicine

## 2021-05-24 DIAGNOSIS — R600 Localized edema: Secondary | ICD-10-CM | POA: Diagnosis not present

## 2021-05-24 DIAGNOSIS — I2699 Other pulmonary embolism without acute cor pulmonale: Secondary | ICD-10-CM | POA: Diagnosis not present

## 2021-05-24 DIAGNOSIS — R0602 Shortness of breath: Secondary | ICD-10-CM | POA: Diagnosis present

## 2021-05-24 DIAGNOSIS — I2602 Saddle embolus of pulmonary artery with acute cor pulmonale: Secondary | ICD-10-CM

## 2021-05-24 DIAGNOSIS — I82411 Acute embolism and thrombosis of right femoral vein: Secondary | ICD-10-CM | POA: Diagnosis not present

## 2021-05-24 DIAGNOSIS — E876 Hypokalemia: Secondary | ICD-10-CM | POA: Diagnosis present

## 2021-05-24 DIAGNOSIS — I509 Heart failure, unspecified: Secondary | ICD-10-CM | POA: Diagnosis not present

## 2021-05-24 DIAGNOSIS — J9601 Acute respiratory failure with hypoxia: Secondary | ICD-10-CM | POA: Diagnosis present

## 2021-05-24 DIAGNOSIS — G8929 Other chronic pain: Secondary | ICD-10-CM | POA: Diagnosis present

## 2021-05-24 DIAGNOSIS — I82431 Acute embolism and thrombosis of right popliteal vein: Secondary | ICD-10-CM | POA: Diagnosis not present

## 2021-05-24 DIAGNOSIS — I110 Hypertensive heart disease with heart failure: Secondary | ICD-10-CM | POA: Diagnosis not present

## 2021-05-24 DIAGNOSIS — R609 Edema, unspecified: Secondary | ICD-10-CM | POA: Diagnosis not present

## 2021-05-24 DIAGNOSIS — E785 Hyperlipidemia, unspecified: Secondary | ICD-10-CM | POA: Diagnosis present

## 2021-05-24 DIAGNOSIS — I2692 Saddle embolus of pulmonary artery without acute cor pulmonale: Secondary | ICD-10-CM | POA: Diagnosis not present

## 2021-05-24 DIAGNOSIS — Z833 Family history of diabetes mellitus: Secondary | ICD-10-CM | POA: Diagnosis not present

## 2021-05-24 DIAGNOSIS — Z79899 Other long term (current) drug therapy: Secondary | ICD-10-CM | POA: Diagnosis not present

## 2021-05-24 DIAGNOSIS — I82441 Acute embolism and thrombosis of right tibial vein: Secondary | ICD-10-CM | POA: Diagnosis not present

## 2021-05-24 DIAGNOSIS — Z20822 Contact with and (suspected) exposure to covid-19: Secondary | ICD-10-CM | POA: Diagnosis not present

## 2021-05-24 DIAGNOSIS — I517 Cardiomegaly: Secondary | ICD-10-CM | POA: Diagnosis not present

## 2021-05-24 DIAGNOSIS — M1990 Unspecified osteoarthritis, unspecified site: Secondary | ICD-10-CM | POA: Diagnosis present

## 2021-05-24 DIAGNOSIS — I2694 Multiple subsegmental pulmonary emboli without acute cor pulmonale: Secondary | ICD-10-CM | POA: Diagnosis not present

## 2021-05-24 DIAGNOSIS — Z6836 Body mass index (BMI) 36.0-36.9, adult: Secondary | ICD-10-CM | POA: Diagnosis not present

## 2021-05-24 DIAGNOSIS — I5033 Acute on chronic diastolic (congestive) heart failure: Secondary | ICD-10-CM | POA: Diagnosis not present

## 2021-05-24 DIAGNOSIS — R059 Cough, unspecified: Secondary | ICD-10-CM | POA: Diagnosis not present

## 2021-05-24 DIAGNOSIS — I2781 Cor pulmonale (chronic): Secondary | ICD-10-CM | POA: Diagnosis not present

## 2021-05-24 DIAGNOSIS — I700 Atherosclerosis of aorta: Secondary | ICD-10-CM | POA: Diagnosis not present

## 2021-05-24 LAB — LACTIC ACID, PLASMA
Lactic Acid, Venous: 2.2 mmol/L (ref 0.5–1.9)
Lactic Acid, Venous: 2.2 mmol/L (ref 0.5–1.9)

## 2021-05-24 LAB — CBC
HCT: 51.2 % (ref 39.0–52.0)
Hemoglobin: 17.2 g/dL — ABNORMAL HIGH (ref 13.0–17.0)
MCH: 32.7 pg (ref 26.0–34.0)
MCHC: 33.6 g/dL (ref 30.0–36.0)
MCV: 97.3 fL (ref 80.0–100.0)
Platelets: 481 10*3/uL — ABNORMAL HIGH (ref 150–400)
RBC: 5.26 MIL/uL (ref 4.22–5.81)
RDW: 14.4 % (ref 11.5–15.5)
WBC: 11.4 10*3/uL — ABNORMAL HIGH (ref 4.0–10.5)
nRBC: 0 % (ref 0.0–0.2)

## 2021-05-24 LAB — RESP PANEL BY RT-PCR (FLU A&B, COVID) ARPGX2
Influenza A by PCR: NEGATIVE
Influenza B by PCR: NEGATIVE
SARS Coronavirus 2 by RT PCR: NEGATIVE

## 2021-05-24 LAB — BASIC METABOLIC PANEL
Anion gap: 11 (ref 5–15)
BUN: 9 mg/dL (ref 8–23)
CO2: 36 mmol/L — ABNORMAL HIGH (ref 22–32)
Calcium: 8.8 mg/dL — ABNORMAL LOW (ref 8.9–10.3)
Chloride: 91 mmol/L — ABNORMAL LOW (ref 98–111)
Creatinine, Ser: 0.89 mg/dL (ref 0.61–1.24)
GFR, Estimated: >60 mL/min (ref >60)
Glucose, Bld: 104 mg/dL — ABNORMAL HIGH (ref 70–99)
Potassium: 3.3 mmol/L — ABNORMAL LOW (ref 3.5–5.1)
Sodium: 138 mmol/L (ref 135–145)

## 2021-05-24 LAB — PHOSPHORUS: Phosphorus: 4 mg/dL (ref 2.5–4.6)

## 2021-05-24 LAB — RAPID URINE DRUG SCREEN, HOSP PERFORMED
Amphetamines: NOT DETECTED
Barbiturates: NOT DETECTED
Benzodiazepines: NOT DETECTED
Cocaine: NOT DETECTED
Opiates: NOT DETECTED
Tetrahydrocannabinol: NOT DETECTED

## 2021-05-24 LAB — D-DIMER, QUANTITATIVE: D-Dimer, Quant: 3.58 ug/mL-FEU — ABNORMAL HIGH (ref 0.00–0.50)

## 2021-05-24 LAB — HEPARIN LEVEL (UNFRACTIONATED): Heparin Unfractionated: 0.11 IU/mL — ABNORMAL LOW (ref 0.30–0.70)

## 2021-05-24 LAB — BRAIN NATRIURETIC PEPTIDE: B Natriuretic Peptide: 568.7 pg/mL — ABNORMAL HIGH (ref 0.0–100.0)

## 2021-05-24 LAB — TROPONIN I (HIGH SENSITIVITY): Troponin I (High Sensitivity): 10 ng/L (ref <18)

## 2021-05-24 LAB — ETHANOL: Alcohol, Ethyl (B): <10 mg/dL (ref <10)

## 2021-05-24 LAB — MAGNESIUM: Magnesium: 2.1 mg/dL (ref 1.7–2.4)

## 2021-05-24 MED ORDER — HEPARIN BOLUS VIA INFUSION
5000.0000 [IU] | Freq: Once | INTRAVENOUS | Status: AC
  Administered 2021-05-24: 5000 [IU] via INTRAVENOUS
  Filled 2021-05-24: qty 5000

## 2021-05-24 MED ORDER — ACETAMINOPHEN 500 MG PO TABS: 500.0000 mg | ORAL_TABLET | Freq: Four times a day (QID) | ORAL | Status: DC | PRN

## 2021-05-24 MED ORDER — POTASSIUM CHLORIDE CRYS ER 20 MEQ PO TBCR
20.0000 meq | EXTENDED_RELEASE_TABLET | Freq: Every day | ORAL | Status: DC
  Administered 2021-05-24: 20 meq via ORAL
  Filled 2021-05-24: qty 1

## 2021-05-24 MED ORDER — ADULT MULTIVITAMIN W/MINERALS CH
1.0000 | ORAL_TABLET | Freq: Every day | ORAL | Status: DC
  Administered 2021-05-24 – 2021-05-29 (×6): 1 via ORAL
  Filled 2021-05-24 (×6): qty 1

## 2021-05-24 MED ORDER — ENOXAPARIN SODIUM 40 MG/0.4ML IJ SOSY: 40.0000 mg | PREFILLED_SYRINGE | INTRAMUSCULAR | Status: DC

## 2021-05-24 MED ORDER — TRAMADOL HCL 50 MG PO TABS: 50.0000 mg | ORAL_TABLET | Freq: Four times a day (QID) | ORAL | Status: DC | PRN

## 2021-05-24 MED ORDER — HEPARIN (PORCINE) 25000 UT/250ML-% IV SOLN
1950.0000 [IU]/h | INTRAVENOUS | Status: DC
  Administered 2021-05-24: 1600 [IU]/h via INTRAVENOUS
  Administered 2021-05-24: 1350 [IU]/h via INTRAVENOUS
  Administered 2021-05-25: 1750 [IU]/h via INTRAVENOUS
  Administered 2021-05-26 – 2021-05-28 (×5): 1950 [IU]/h via INTRAVENOUS
  Filled 2021-05-24 (×8): qty 250

## 2021-05-24 MED ORDER — FUROSEMIDE 10 MG/ML IJ SOLN
40.0000 mg | Freq: Once | INTRAMUSCULAR | Status: AC
  Administered 2021-05-24: 40 mg via INTRAVENOUS

## 2021-05-24 MED ORDER — OXYCODONE HCL 5 MG PO TABS: 5.0000 mg | ORAL_TABLET | Freq: Four times a day (QID) | ORAL | Status: DC | PRN

## 2021-05-24 MED ORDER — PERFLUTREN LIPID MICROSPHERE
1.0000 mL | INTRAVENOUS | Status: AC | PRN
  Administered 2021-05-24: 2 mL via INTRAVENOUS
  Filled 2021-05-24: qty 10

## 2021-05-24 MED ORDER — HEPARIN BOLUS VIA INFUSION
2650.0000 [IU] | Freq: Once | INTRAVENOUS | Status: AC
  Administered 2021-05-24: 2650 [IU] via INTRAVENOUS
  Filled 2021-05-24: qty 2650

## 2021-05-24 MED ORDER — GUAIFENESIN-DM 100-10 MG/5ML PO SYRP
5.0000 mL | ORAL_SOLUTION | ORAL | Status: DC | PRN
  Administered 2021-05-25: 5 mL via ORAL
  Filled 2021-05-24: qty 5

## 2021-05-24 MED ORDER — FUROSEMIDE 10 MG/ML IJ SOLN
20.0000 mg | Freq: Every day | INTRAMUSCULAR | Status: DC
  Administered 2021-05-24: 20 mg via INTRAVENOUS
  Filled 2021-05-24 (×2): qty 2

## 2021-05-24 MED ORDER — TETRAHYDROZOLINE HCL 0.05 % OP SOLN: 1.0000 [drp] | Freq: Every day | OPHTHALMIC | Status: DC | PRN

## 2021-05-24 MED ORDER — POLYETHYLENE GLYCOL 3350 17 G PO PACK
17.0000 g | PACK | Freq: Every day | ORAL | Status: DC
  Administered 2021-05-24 – 2021-05-27 (×4): 17 g via ORAL
  Filled 2021-05-24 (×6): qty 1

## 2021-05-24 MED ORDER — IOHEXOL 350 MG/ML SOLN
75.0000 mL | Freq: Once | INTRAVENOUS | Status: AC | PRN
  Administered 2021-05-24: 75 mL via INTRAVENOUS

## 2021-05-24 MED ORDER — OMEGA-3-ACID ETHYL ESTERS 1 G PO CAPS
1.0000 g | ORAL_CAPSULE | Freq: Every day | ORAL | Status: DC
  Administered 2021-05-24 – 2021-05-29 (×6): 1 g via ORAL
  Filled 2021-05-24 (×6): qty 1

## 2021-05-24 MED ORDER — ACETAMINOPHEN 500 MG PO TABS
500.0000 mg | ORAL_TABLET | Freq: Four times a day (QID) | ORAL | Status: DC | PRN
  Administered 2021-05-27: 500 mg via ORAL
  Filled 2021-05-24: qty 1

## 2021-05-24 MED ORDER — POTASSIUM CHLORIDE CRYS ER 20 MEQ PO TBCR
40.0000 meq | EXTENDED_RELEASE_TABLET | Freq: Once | ORAL | Status: AC
  Administered 2021-05-24: 40 meq via ORAL
  Filled 2021-05-24: qty 2

## 2021-05-24 NOTE — Progress Notes (Signed)
  Echocardiogram 2D Echocardiogram has been performed.  Alexander Robles 05/24/2021, 12:03 PM

## 2021-05-24 NOTE — Consult Note (Signed)
Cardiology Consultation:   Patient ID: Alexander Robles; [id number]; Jul 02, 1951   Admit date: 05/23/2021 Date of Consult: 05/24/2021  Primary Care Provider: Wenda Low, MD Primary Cardiologist: New to [facility]   Patient Profile:   Alexander Robles is a 70 y.o. male with a hx of HTN and ETOH abuse who is being seen today for the evaluation of CHF at the request of Dr. Nevada Crane.  History of Present Illness:   Alexander Robles is a 58yp M with a hx as stated above who presented to [facility] with worsening SOB, LE edema and a non-productive cough over the past several days prior to ED presentation. Prior to that he was in his usual state of health. He lives alone, continues to drive and performs all of his own ADLs. He states he initially felt his symptoms were related to his alcohol use therefore he decided to stop drinking and wanted to lose weight and follow up with his PCP. Given that his symptoms were not improving, he went to an outside Urgent Care center for further evaluation. On arrival, he was found to be hypoxic with Spo2 in the 80's. He was sent to [facility] at which time CXR showed pulmonary edema. BNP found to be 615. He was started on IV Lasix and admitted per [facility] service. BP 170/87, pulse 84, respiratory rate 14, O2 saturation 95% on room air.  Lab studies remarkable for serum sodium 136, potassium 3.3, serum bicarb 33, glucose 111, BUN 10, creatinine 0.95, T bilirubin 1.4. BNP 614.  WBC 10.5, hemoglobin 16.6, MCV 100, platelet count 457. Echocardiogram performed 05/24/21 with pending results.  HsT normal at 9>>10. D dimer elevated at 3.58. CTA performed which was positive for acute PE with right heart strain consistent with at least submassive PE. He was started on IV Heparin for PE and cardiology askled to follow.   He denies chest pain or other anginal symptoms. He denies palpitations, recent illness with fever or chills. He denies a hx of tobacco use. No prior hx of DVT or clotting disorder. Drinks  mixed liquor drinks "all day" about every other day. No prior CAD hx.   Past Medical History:  Diagnosis Date   Carpal tunnel syndrome    Cubital tunnel syndrome on right    Hypertension    states under control with meds., has been on med. x 5 yr.   Skin disease, fungal    on back   Wears partial dentures    upper    Past Surgical History:  Procedure Laterality Date   CARPAL TUNNEL RELEASE Right 08/02/2017   Procedure: RIGHT CARPAL TUNNEL RELEASE;  Surgeon: Daryll Brod, MD;  Location: [place];  Service: Orthopedics;  Laterality: Right;  AX BLOCK   COLONOSCOPY     ULNAR NERVE TRANSPOSITION Right 08/02/2017   Procedure: RIGHT ULNAR NERVE DECOMPRESSION;  Surgeon: Daryll Brod, MD;  Location: [place];  Service: Orthopedics;  Laterality: Right;     Prior to Admission medications   Medication Sig Start Date End Date Taking? Authorizing Provider  acetaminophen (TYLENOL) 500 MG tablet Take 500 mg by mouth every 6 (six) hours as needed for moderate pain or headache.   Yes [provider]  guaiFENesin (ROBITUSSIN) 100 MG/5ML SOLN Take 10 mLs by mouth every 4 (four) hours as needed for cough or to loosen phlegm.   Yes [provider]  Melatonin 10 MG TABS Take 10 mg by mouth at bedtime as needed (sleep).  Yes [provider]  Multiple Vitamins-Minerals (CENTRUM SILVER 50+MEN) TABS Take 1 tablet by mouth daily.   Yes [provider]  Omega-3 Fatty Acids (FISH OIL) 1000 MG CPDR Take 1,000 mg by mouth daily.   Yes [provider]  Tetrahydrozoline HCl (VISINE OP) Place 1 drop into both eyes daily as needed (red itchy eyes).   Yes [provider]  amLODipine (NORVASC) 10 MG tablet Take 10 mg by mouth daily.    [provider]  doxazosin (CARDURA) 8 MG tablet Take 8 mg by mouth at bedtime.    [provider]    Inpatient Medications: Scheduled Meds:  multivitamin with minerals  1 tablet Oral  Daily   omega-3 acid ethyl esters  1 g Oral Daily   polyethylene glycol  17 g Oral Daily   potassium chloride  20 mEq Oral Daily   Continuous Infusions:  heparin 1,350 Units/hr (05/24/21 1030)   PRN Meds: acetaminophen, guaiFENesin-dextromethorphan, oxyCODONE, tetrahydrozoline, traMADol  Allergies:   No Known Allergies  Social History:   Social History   Socioeconomic History   Marital status: Divorced    Spouse name: Not on file   Number of children: Not on file   Years of education: Not on file   Highest education level: Not on file  Occupational History   Not on file  Tobacco Use   Smoking status: Never   Smokeless tobacco: Never  Vaping Use   Vaping Use: Never used  Substance and Sexual Activity   Alcohol use: Not Currently    Comment: 7 drinks/week.  05/23/2021-states he stopped drinking 2 weeks ago   Drug use: No   Sexual activity: Not on file  Other Topics Concern   Not on file  Social History Narrative   Not on file   Social Determinants of Health   Financial Resource Strain: Not on file  Food Insecurity: Not on file  Transportation Needs: Not on file  Physical Activity: Not on file  Stress: Not on file  Social Connections: Not on file  Intimate Partner Violence: Not on file    Family History:   History reviewed. No pertinent family history. Family Status:  Family Status  Relation Name Status   Mother  Deceased   Father  Deceased    ROS:  Please see the history of present illness.  All other ROS reviewed and negative.     Physical Exam/Data:   Vitals:   05/24/21 0318 05/24/21 0322 05/24/21 0841 05/24/21 1100  BP:  113/86 122/90   Pulse:  88 92 78  Resp:  20 20   Temp:  (!) 97.5 F (36.4 C) 98.6 F (37 C) 98.2 F (36.8 C)  TempSrc:  Oral Oral Oral  SpO2:  90% 92%   Weight: 101.8 kg     Height: 5\' 7"  (1.702 m)       Intake/Output Summary (Last 24 hours) at 05/24/2021 1509 Last data filed at 05/24/2021 1457 Gross per 24 hour  Intake  720 ml  Output 2750 ml  Net -2030 ml   Filed Weights   05/23/21 2038 05/24/21 0318  Weight: 109.3 kg 101.8 kg   Body mass index is 35.15 kg/m.   General: Well developed, well nourished, NAD Neck: Negative for carotid bruits. No JVD Lungs: Diminished in bilateral lower lobes. Breathing is labored with speaking Cardiovascular: RRR with S1 S2. No murmurs Abdomen: Soft, non-tender, non-distended. No obvious abdominal masses. Extremities: 2+ BLE edema. Radial pulses 2+ bilaterally Neuro: Alert  and oriented. No focal deficits. No facial asymmetry. MAE spontaneously. Psych: Responds to questions appropriately with normal affect.    EKG:  The EKG was personally reviewed and demonstrates: 05/24/21 NSR with HR 84bpm, QT prolonged 550 with S1Q3T3 changes  Telemetry:  Telemetry was personally reviewed and demonstrates: 05/24/21 NSR with HR in the 80-90  Relevant CV Studies:  ECHO: Pending results   Laboratory Data:  Chemistry Recent Labs  Lab 05/23/21 2051 05/24/21 0337  NA 136 138  K 3.3* 3.3*  CL 92* 91*  CO2 33* 36*  GLUCOSE 111* 104*  BUN 10 9  CREATININE 0.95 0.89  CALCIUM 8.8* 8.8*  GFRNONAA >60 >60  ANIONGAP 11 11    Total Protein  Date Value Ref Range Status  05/23/2021 7.1 6.5 - 8.1 g/dL Final   Albumin  Date Value Ref Range Status  05/23/2021 3.3 (L) 3.5 - 5.0 g/dL Final   AST  Date Value Ref Range Status  05/23/2021 25 15 - 41 U/L Final   ALT  Date Value Ref Range Status  05/23/2021 24 0 - 44 U/L Final   Alkaline Phosphatase  Date Value Ref Range Status  05/23/2021 80 38 - 126 U/L Final   Total Bilirubin  Date Value Ref Range Status  05/23/2021 1.4 (H) 0.3 - 1.2 mg/dL Final   Hematology Recent Labs  Lab 05/23/21 2051 05/24/21 0337  WBC 10.5 11.4*  RBC 5.16 5.26  HGB 16.6 17.2*  HCT 51.6 51.2  MCV 100.0 97.3  MCH 32.2 32.7  MCHC 32.2 33.6  RDW 14.4 14.4  PLT 457* 481*   Cardiac EnzymesNo results for input(s): TROPONINI in the last 168  hours. No results for input(s): TROPIPOC in the last 168 hours.  BNP Recent Labs  Lab 05/23/21 2051 05/24/21 0337  BNP 614.0* 568.7*    DDimer  Recent Labs  Lab 05/24/21 0358  DDIMER 3.58*   TSH: No results found for: TSH Lipids:No results found for: CHOL, HDL, LDLCALC, LDLDIRECT, TRIG, CHOLHDL HgbA1c:No results found for: HGBA1C  Radiology/Studies:  DG Chest 2 View  Result Date: 05/23/2021 CLINICAL DATA:  Shortness of breath which increases movement for 1 week, cough for 1 week, bilateral ankle swelling EXAM: CHEST - 2 VIEW COMPARISON:  Radiograph 08/25/2020 FINDINGS: Hazy perihilar and basilar predominant opacities present in both lungs. Pulmonary vascular congestion and redistribution. Prominent cardiac silhouette, may be accentuated by AP technique. No visible layering effusion or discernible pneumothorax. No acute osseous or soft tissue abnormality. Degenerative changes are present in the imaged spine and shoulders. IMPRESSION: Hazy perihilar and basilar opacities, may reflect atelectasis or developing pulmonary edema with additional features which could suggest some degree heart failure or volume overload including vascular congestion and enlarged cardiac silhouette. Electronically Signed   By: Lovena Le M.D.   On: 05/23/2021 22:02   CT Angio Chest Pulmonary Embolism (PE) W or WO Contrast  Result Date: 05/24/2021 CLINICAL DATA:  Shortness of breath.  Pulmonary emboli suspected. EXAM: CT ANGIOGRAPHY CHEST WITH CONTRAST TECHNIQUE: Multidetector CT imaging of the chest was performed using the standard protocol during bolus administration of intravenous contrast. Multiplanar CT image reconstructions and MIPs were obtained to evaluate the vascular anatomy. CONTRAST:  44mL OMNIPAQUE IOHEXOL 350 MG/ML SOLN COMPARISON:  Chest radiography same day FINDINGS: Cardiovascular: The heart is enlarged. Right ventricle is enlarged relative to the left, suggesting right ventricular strain. No  pericardial effusion. Pulmonary arterial opacification is early but satisfactory. There is massive bilateral pulmonary embolism, more extensive on  the right than the left. Mediastinum/Nodes: No mass or lymphadenopathy. Lungs/Pleura: Patchy density in both lungs that could be atelectasis or early pulmonary infarction. No pleural effusion. Upper Abdomen: Likely 1 cm cyst in the right lobe of the liver. Musculoskeletal: Ordinary thoracic degenerative changes. Review of the MIP images confirms the above findings. IMPRESSION: Positive for acute PE with CT evidence of right heart strain consistent with at least submassive (intermediate risk) PE. The presence of right heart strain has been associated with an increased risk of morbidity and mortality. Please refer to the "PE Focused" order set in EPIC. Call report in progress. Electronically Signed   By: Nelson Chimes M.D.   On: 05/24/2021 08:35   DG CHEST PORT 1 VIEW  Result Date: 05/24/2021 CLINICAL DATA:  70 year old male with shortness of breath. EXAM: PORTABLE CHEST 1 VIEW COMPARISON:  Chest radiographs 05/23/2021 and earlier. FINDINGS: Portable AP semi upright view at 0516 hours. Continued low lung volumes. Stable cardiac size and mediastinal contours. Questionable cardiomegaly. No pneumothorax or consolidation. Mildly increased interstitial markings in both lungs. Mild patchy opacity at the left lung base. No definite effusion. Negative visible bowel gas. No acute osseous abnormality identified. IMPRESSION: Continued low lung volumes. Increased interstitial markings and patchy left lung base opacity might simply reflect atelectasis, but mild or developing interstitial edema or infection are difficult to exclude. Electronically Signed   By: Genevie Ann M.D.   On: 05/24/2021 05:39    Assessment and Plan:   1. Acute CHF:  -Presented with a week long hx of SOB, LE edema, and cough found to be volume overloaded on exam with pulmonary edema on CXR and BNP >600 -He was  treated with IV Lasix with good response.  -Echocardiogram performed 05/24/21 with pending results -HsT normal at 9>>10.  -D dimer at 3.58. CTA performed which was positive for acute PE with right heart strain consistent with at least submassive PE>>>started on IV Heparin for PE and cardiology askled to follow.  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 Acute PE with acute hypoxic respiratory failure: -Found to be hypoxic on urgent care evaluation>>sent to [facility] found to have a submassive PE per CTA.  -Pt maintaining adequate O2 saturations on 5L with SpO2 in the upper 80's-low 90's  -Continue with IV Heaprin and will transition to PO AC prior to D/C  3. HTN: -Stable, 122/90>>113/86 -On PTA amlodipine 10 -Currently on no antihypertensives   4. ETOH: -Reports "all day" drinking about every other day with mixed liquor drinks -Cessation encouraged -Pt seems open to stopping   5. Hypokalemia: -K+ 3.3 today  -Per IM   6. Leukocytosis: -WBC, 11.4 today>>up from 10.5 -Blood CX obtained    For questions or updates, please contact [place] Please consult [email or web address] for contact info under Cardiology/STEMI.   SignedKathyrn Drown NP-C [place] Pager: [phone] 05/24/2021 3:09 PM

## 2021-05-24 NOTE — Plan of Care (Signed)

## 2021-05-24 NOTE — Progress Notes (Signed)
Patient receiving heparin therapy for PE treatment.  Educated as to purpose of medication.

## 2021-05-24 NOTE — H&P (Addendum)
History and Physical  Alexander Robles GXQ:[id number] DOB: April 20, 1951 DOA: 05/23/2021  Referring physician: Valda Favia PCP: Wenda Low, MD  Outpatient Specialists: None. Patient coming from: Home.  Chief Complaint: Shortness of breath, legs swelling  HPI: Alexander Robles is a 70 y.o. male with medical history significant for essential hypertension, who presented to [facility] ED with complaints of gradually worsening shortness of breath in the last few days, associated with non productive cough and bilateral lower extremity edema which he noted about 2 months ago, improved after stopping alcohol use 3 weeks ago.  Admits to a sedentary lifestyle sitting for 7-8 hours per day due to chronic bilateral knee pain.  Denies any fevers.  Presented to the ED for further evaluation.  Initially went to urgent care where he was noted to be hypoxic with O2 saturation in the 80s on room air.  Not on oxygen supplementation at baseline.  Was sent to [facility] ED where work-up revealed acute CHF with pulmonary edema on chest x-ray, bilateral lower extremity edema and elevated BNP greater than 600.  He was started on IV Lasix in the ED with improvement of his symptomatology.  TRH, hospitalist team, was asked to admit.    ED Course:  Temperature 98.2.  BP 170/87, pulse 84, respiratory rate 14, O2 saturation 95% on room air.  Lab studies remarkable for serum sodium 136, potassium 3.3, serum bicarb 33, glucose 111, BUN 10, creatinine 0.95, T bilirubin 1.4.  GFR greater than 60.  BNP 614.  WBC 10.5, hemoglobin 16.6, MCV 100, platelet count 457.  Review of Systems: Review of systems as noted in the HPI. All other systems reviewed and are negative.   Past Medical History:  Diagnosis Date   Carpal tunnel syndrome    Cubital tunnel syndrome on right    Hypertension    states under control with meds., has been on med. x 5 yr.   Skin disease, fungal    on back   Wears partial dentures    upper   Past  Surgical History:  Procedure Laterality Date   CARPAL TUNNEL RELEASE Right 08/02/2017   Procedure: RIGHT CARPAL TUNNEL RELEASE;  Surgeon: Daryll Brod, MD;  Location: [place];  Service: Orthopedics;  Laterality: Right;  AX BLOCK   COLONOSCOPY     ULNAR NERVE TRANSPOSITION Right 08/02/2017   Procedure: RIGHT ULNAR NERVE DECOMPRESSION;  Surgeon: Daryll Brod, MD;  Location: [place];  Service: Orthopedics;  Laterality: Right;    Social History:  reports that he has never smoked. He has never used smokeless tobacco. He reports previous alcohol use. He reports that he does not use drugs.   No Known Allergies  Family history: Sister and mother with diabetes type 2.  Prior to Admission medications   Medication Sig Start Date End Date Taking? Authorizing Provider  acetaminophen (TYLENOL) 500 MG tablet Take 500 mg by mouth every 6 (six) hours as needed for moderate pain or headache.   Yes [provider]  Multiple Vitamins-Minerals (CENTRUM SILVER 50+MEN) TABS Take 1 tablet by mouth daily.   Yes [provider]  Omega-3 Fatty Acids (FISH OIL) 1000 MG CPDR Take 1,000 mg by mouth daily.   Yes [provider]  Tetrahydrozoline HCl (VISINE OP) Place 1 drop into both eyes daily as needed (red itchy eyes).   Yes [provider]  HYDROcodone-acetaminophen (NORCO) 10-325 MG tablet Take 1 tablet by mouth every 6 (six) hours as needed. Patient not  taking: No sig reported 08/02/17   Daryll Brod, MD  losartan-hydrochlorothiazide (HYZAAR) 100-25 MG per tablet Take 1 tablet by mouth daily.    [provider]    Physical Exam: BP (!) 127/96   Pulse 84   Temp 98.2 F (36.8 C) (Oral)   Resp (!) 32   Ht 5\' 8"  (1.727 m)   Wt 109.3 kg   SpO2 99%   BMI 36.64 kg/m   General: 70 y.o. year-old male well developed well nourished in no acute distress.  Alert and oriented x3. Cardiovascular: Regular rate and rhythm with no rubs or  gallops.  No thyromegaly or JVD noted.  3+ pitting edema in lower extremities bilaterally.  Respiratory: Mild rales at bases.  No wheezing noted.  Poor inspiratory effort. Abdomen: Soft nontender nondistended with normal bowel sounds x4 quadrants. Muskuloskeletal: No cyanosis or clubbing.  3+ pitting edema in lower extremities bilaterally. Neuro: CN II-XII intact, strength, sensation, reflexes Skin: No ulcerative lesions noted or rashes Psychiatry: Judgement and insight appear normal. Mood is appropriate for condition and setting          Labs on Admission:  Basic Metabolic Panel: Recent Labs  Lab 05/23/21 2051  NA 136  K 3.3*  CL 92*  CO2 33*  GLUCOSE 111*  BUN 10  CREATININE 0.95  CALCIUM 8.8*   Liver Function Tests: Recent Labs  Lab 05/23/21 2051  AST 25  ALT 24  ALKPHOS 80  BILITOT 1.4*  PROT 7.1  ALBUMIN 3.3*   No results for input(s): LIPASE, AMYLASE in the last 168 hours. No results for input(s): AMMONIA in the last 168 hours. CBC: Recent Labs  Lab 05/23/21 2051  WBC 10.5  NEUTROABS 8.2*  HGB 16.6  HCT 51.6  MCV 100.0  PLT 457*   Cardiac Enzymes: No results for input(s): CKTOTAL, CKMB, CKMBINDEX, TROPONINI in the last 168 hours.  BNP (last 3 results) Recent Labs    05/23/21 2051  BNP 614.0*    ProBNP (last 3 results) No results for input(s): PROBNP in the last 8760 hours.  CBG: No results for input(s): GLUCAP in the last 168 hours.  Radiological Exams on Admission: DG Chest 2 View  Result Date: 05/23/2021 CLINICAL DATA:  Shortness of breath which increases movement for 1 week, cough for 1 week, bilateral ankle swelling EXAM: CHEST - 2 VIEW COMPARISON:  Radiograph 08/25/2020 FINDINGS: Hazy perihilar and basilar predominant opacities present in both lungs. Pulmonary vascular congestion and redistribution. Prominent cardiac silhouette, may be accentuated by AP technique. No visible layering effusion or discernible pneumothorax. No acute osseous  or soft tissue abnormality. Degenerative changes are present in the imaged spine and shoulders. IMPRESSION: Hazy perihilar and basilar opacities, may reflect atelectasis or developing pulmonary edema with additional features which could suggest some degree heart failure or volume overload including vascular congestion and enlarged cardiac silhouette. Electronically Signed   By: Lovena Le M.D.   On: 05/23/2021 22:02    EKG: I independently viewed the EKG done and my findings are as followed: Sinus rhythm rate of 95.  Nonspecific ST-T changes.  QTc 505.  Assessment/Plan Present on Admission: **None**  Active Problems:   Acute CHF (congestive heart failure) (HCC)  Acute CHF, unspecified. Presented with dyspnea of 1 week duration, associated bilateral lower extremity edema, elevated BNP greater than 600, pulmonary edema seen on chest x-ray, personally reviewed. Received IV Lasix in the ED, continue diuresing. His soft blood pressures preclude aggressive IV diuresing. Obtain 2D echo Strict  I's and O's and daily weight Please consult cardiology in the morning.  Acute hypoxic respiratory failure secondary to pulmonary edema, likely cardiogenic Ongoing diuresing Currently requiring 5 L to maintain a saturation above 92% Wean off oxygen supplementation as tolerated Obtain D-dimer and bilateral lower extremity Doppler ultrasound. Antitussives for nonproductive cough Incentive spirometer Mobilize as tolerated with PT OT  Hypokalemia Serum potassium 3.3 Repleted orally Continue potassium replacement while on diuretics. Obtain magnesium level, repeat BMP in the morning.  Obesity BMI 36 Recommend weight loss outpatient with regular physical activity and healthy dieting.  Bilateral lower extremity edema Self-reported sedentary lifestyle sitting for 7 to 8 hours/day Obtain Doppler ultrasound of lower extremities to rule out DVT.  Prolonged QTC QTC from admission twelve-lead EKG  505 Avoid QTC prolonging agents Optimize magnesium and potassium levels. Repeat twelve-lead EKG in the morning.   DVT prophylaxis: Subcu Lovenox daily  Code Status: Full code  Family Communication: None at bedside  Disposition Plan: Admit to telemetry cardiac unit.  Consults called: Please consult cardiology in the morning  Admission status: Inpatient status.  Patient will require at least 2 midnights for further evaluation and treatment of present condition.   Status is: Inpatient    Dispo:  Patient From: Home  Planned Disposition: Home, possibly on 05/25/2021 or when cardiology signs off.  Medically stable for discharge: No         Kayleen Memos MD [facility] Pager [phone]  If 7PM-7AM, please contact night-coverage [email or web address] Password [email or web address]  05/24/2021, 1:19 AM

## 2021-05-24 NOTE — Progress Notes (Signed)
  PROGRESS NOTE    Alexander Robles  HDQ:[id number] DOB: 22-Sep-1951 DOA: 05/23/2021  PCP: Wenda Low, MD    LOS - 0    Patient admitted after midnight with shortness of breath.  Started initially on IV diuresis for decompensated CHF.  Subsequently, CTA chest resulted with at least submassive PE.  Started on heparin this AM.  Interval subjective: Pt seen at bedside.  We discussed the CTA findings of PE and management.  Dr. Tamala Julian with pulmonology in to see patient and discussed as well.  Pt denies CP.  Reports ongoing cough and significant SOB esp on exertion.  Exam: awake & alert, oriented x3, no acute distress. Lungs clear with shallow inspirations. Abdomen soft non-tender Extremities with improving pedal edema, normal tone Heart RRR   Active Problems:   Acute CHF (congestive heart failure) ([place])    I have reviewed the full H&P by Dr. Nevada Crane in detail, and I agree with the assessment and plan as outlined therein.  In addition:  CTA chest with at least submassive PE with right heart strain.    --Heparin drip started --PCCM consulted for consideration of TPA vs IR intervention --Follow up Echo --Stop diuresis and monitor volume status --Replacing potassium   Tine spent: 30 minutes with > 50% spent at bedside and in coordination of care.    Ezekiel Slocumb, DO [facility]   If 7PM-7AM, please contact night-coverage [email or web address] 05/24/2021, 3:09 PM

## 2021-05-24 NOTE — Consult Note (Signed)
NAME:  Alexander Robles, MRN:  [id number], DOB:  October 19, 1951, LOS: 0 ADMISSION DATE:  05/23/2021, CONSULTATION DATE:  05/24/21 REFERRING MD:  Arbutus Ped, CHIEF COMPLAINT:  sob   History of Present Illness:  70 year old man with hx of HTN presenting with worsening DOE for past week.  Became accompanied by cough and pleurisy 2 days ago.  This is in  the setting of 1-2 months worsening lower ext edema that he attributes to poor mobility.  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  Workup in ER revealed possible CHF flare.  Subsequent workup showed elevated D dimer and CTA showing submassive PE.  PCCM consulted for consideration of advanced PE therapies.  No syncope, had one episode of feeling lightheaded when he over-exerted himself a couple days ago, thinks from breathlessness.  Pertinent  Medical History  HTN Osteoarthritis No history of strokes, bleeding issues, surgeries  Significant Hospital Events: Including procedures, antibiotic start and stop dates in addition to other pertinent events   7/13 admitted  Interim History / Subjective:  consulted  Objective   Blood pressure 122/90, pulse 92, temperature 98.6 F (37 C), temperature source Oral, resp. rate 20, height 5\' 7"  (1.702 m), weight 101.8 kg, SpO2 92 %.        Intake/Output Summary (Last 24 hours) at 05/24/2021 1035 Last data filed at 05/24/2021 0949 Gross per 24 hour  Intake 480 ml  Output 2250 ml  Net -1770 ml   Filed Weights   05/23/21 2038 05/24/21 0318  Weight: 109.3 kg 101.8 kg    Examination: General: mildly tachypneic after being told he has a blood clot in lung HENT: MMM, malampatti 4 Lungs: Clear, no wheezing or crackles, tachypneic I suspect from anxiety Cardiovascular: RRR, ext warm Abdomen: soft, +BS Extremities: bilateral edema Neuro: moves all 4 ext to command Psych: Anxious  No trop leak CTA with high clot burden, enlarged PA, elevated RV/LV ratio  and some contrast reflux into IVC c/w RV overload  Resolved Hospital Problem list   N/a  Assessment & Plan:  Acute submassive pulmonary embolism- PESI 121 due to tachypnea, O2 need, age, male sex.  Class IV at higher risk of mortality.  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  He has no concerning clinical history and vitals are benign.  Troponin negative, lactate negative.  BNP pending.  No contraindications for thrombolytics. Cough, pleurisy- from above and early infarct in RUL on CT - Continue heparin - f/u BNP - Stat echo, if clot in transit or RV looks bad will consider systemic TPA - Do not see role for catheter directed or mechanical thrombectomy in this patient  - Will need lifelong AC preferably eliquis - Will f/u with patient after reviewing echo - Hold all BP meds and diuretics - Will follow with you  Best Practice (right click and "Reselect all SmartList Selections" daily)  Per primary  Labs   CBC: Recent Labs  Lab 05/23/21 2051 05/24/21 0337  WBC 10.5 11.4*  NEUTROABS 8.2*  --   HGB 16.6 17.2*  HCT 51.6 51.2  MCV 100.0 97.3  PLT 457* 481*    Basic Metabolic Panel: Recent Labs  Lab 05/23/21 2051 05/24/21 0337  NA 136 138  K 3.3* 3.3*  CL 92* 91*  CO2 33* 36*  GLUCOSE 111* 104*  BUN 10  9  CREATININE 0.95 0.89  CALCIUM 8.8* 8.8*  MG  --  2.1  PHOS  --  4.0   GFR: Estimated Creatinine Clearance: 86.6 mL/min (by C-G formula based on SCr of 0.89 mg/dL). Recent Labs  Lab 05/23/21 2051 05/23/21 2057 05/24/21 0337  WBC 10.5  --  11.4*  LATICACIDVEN  --  1.8  --     Liver Function Tests: Recent Labs  Lab 05/23/21 2051  AST 25  ALT 24  ALKPHOS 80  BILITOT 1.4*  PROT 7.1  ALBUMIN 3.3*   No results for input(s): LIPASE, AMYLASE in the last 168 hours. No results for input(s): AMMONIA in the last 168 hours.  ABG No results found for: PHART,  PCO2ART, PO2ART, HCO3, TCO2, ACIDBASEDEF, O2SAT   Coagulation Profile: No results for input(s): INR, PROTIME in the last 168 hours.  Cardiac Enzymes: No results for input(s): CKTOTAL, CKMB, CKMBINDEX, TROPONINI in the last 168 hours.  HbA1C: No results found for: HGBA1C  CBG: No results for input(s): GLUCAP in the last 168 hours.  Review of Systems:    Positive Symptoms in bold:  Constitutional fevers, chills, weight loss, fatigue, anorexia, malaise  Eyes decreased vision, double vision, eye irritation  Ears, Nose, Mouth, Throat sore throat, trouble swallowing, sinus congestion  Cardiovascular chest pain, paroxysmal nocturnal dyspnea, lower ext edema, palpitations   Respiratory SOB, cough, DOE, hemoptysis, wheezing  Gastrointestinal nausea, vomiting, diarrhea  Genitourinary burning with urination, trouble urinating  Musculoskeletal joint aches, joint swelling, back pain  Integumentary  rashes, skin lesions  Neurological focal weakness, focal numbness, trouble speaking, headaches  Psychiatric depression, anxiety, confusion  Endocrine polyuria, polydipsia, cold intolerance, heat intolerance  Hematologic abnormal bruising, abnormal bleeding, unexplained nose bleeds  Allergic/Immunologic recurrent infections, hives, swollen lymph nodes     Past Medical History:  He,  has a past medical history of Carpal tunnel syndrome, Cubital tunnel syndrome on right, Hypertension, Skin disease, fungal, and Wears partial dentures.   Surgical History:   Past Surgical History:  Procedure Laterality Date   CARPAL TUNNEL RELEASE Right 08/02/2017   Procedure: RIGHT CARPAL TUNNEL RELEASE;  Surgeon: Daryll Brod, MD;  Location: [place];  Service: Orthopedics;  Laterality: Right;  AX BLOCK   COLONOSCOPY     ULNAR NERVE TRANSPOSITION Right 08/02/2017   Procedure: RIGHT ULNAR NERVE DECOMPRESSION;  Surgeon: Daryll Brod, MD;  Location: [place];  Service: Orthopedics;   Laterality: Right;     Social History:   reports that he has never smoked. He has never used smokeless tobacco. He reports previous alcohol use. He reports that he does not use drugs.   Family History:  His family history is not on file.   Allergies No Known Allergies   Home Medications  Prior to Admission medications   Medication Sig Start Date End Date Taking? Authorizing Provider  acetaminophen (TYLENOL) 500 MG tablet Take 500 mg by mouth every 6 (six) hours as needed for moderate pain or headache.   Yes [provider]  guaiFENesin (ROBITUSSIN) 100 MG/5ML SOLN Take 10 mLs by mouth every 4 (four) hours as needed for cough or to loosen phlegm.   Yes [provider]  Melatonin 10 MG TABS Take 10 mg by mouth at bedtime as needed (sleep).   Yes [provider]  Multiple Vitamins-Minerals (CENTRUM SILVER 50+MEN) TABS Take 1 tablet by mouth daily.   Yes [provider]  Omega-3 Fatty Acids (FISH OIL) 1000 MG CPDR Take 1,000 mg  by mouth daily.   Yes [provider]  Tetrahydrozoline HCl (VISINE OP) Place 1 drop into both eyes daily as needed (red itchy eyes).   Yes [provider]  amLODipine (NORVASC) 10 MG tablet Take 10 mg by mouth daily.    [provider]  doxazosin (CARDURA) 8 MG tablet Take 8 mg by mouth at bedtime.    [provider]

## 2021-05-24 NOTE — Progress Notes (Signed)
ANTICOAGULATION CONSULT NOTE - Initial Consult  Pharmacy Consult for IV Heparin Indication: pulmonary embolus  No Known Allergies  Patient Measurements: Height: 5\' 7"  (170.2 cm) Weight: 101.8 kg (224 lb 6.4 oz) IBW/kg (Calculated) : 66.1 Heparin Dosing Weight:  88.4 kg  Vital Signs: Temp: 98.3 F (36.8 C) (07/13 1531) Temp Source: Oral (07/13 1531) BP: 117/84 (07/13 1700) Pulse Rate: 82 (07/13 1531)  Labs: Recent Labs    05/23/21 2051 05/23/21 2315 05/24/21 0337 05/24/21 1741  HGB 16.6  --  17.2*  --   HCT 51.6  --  51.2  --   PLT 457*  --  481*  --   HEPARINUNFRC  --   --   --  0.11*  CREATININE 0.95  --  0.89  --   TROPONINIHS 9 10  --   --     Estimated Creatinine Clearance: 86.6 mL/min (by C-G formula based on SCr of 0.89 mg/dL).   Medical History: Past Medical History:  Diagnosis Date   Carpal tunnel syndrome    Cubital tunnel syndrome on right    Hypertension    states under control with meds., has been on med. x 5 yr.   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 CTA today showed submasssive PE with right heart strain. Pharmacy was consulted to dose IV heparin. Pt had Lovenox 40 mg SQ Q 24 hr previously ordered for VTE prophylaxis, but dose had not yet been given. Pt was not on anticoagulant prior to admission.  Initial heparin level ~7 hrs after heparin 5000 units IV bolus X 1, followed by heparin infusion at 1350 units/hr was 0.11 units/ml, which is below the goal range for this pt. H/H 17.2/51.2, plt 481. Per RN, no issues with IV or bleeding observed.  Goal of Therapy:  Heparin level 0.3-0.7 units/ml Monitor platelets by anticoagulation protocol: Yes   Plan:  Heparin 2650 units IV bolus X 1 Increase heparin infusion to 1600 units/hr Check heparin level in ~7 hrs Monitor daily heparin level and CBC while on heparin Monitor for  bleeding  Gillermina Hu, PharmD, BCPS, [facility] Clinical Pharmacist 05/24/2021,7:17 PM

## 2021-05-24 NOTE — Progress Notes (Signed)
ANTICOAGULATION CONSULT NOTE - Initial Consult  Pharmacy Consult for Heparin Indication: pulmonary embolus  No Known Allergies  Patient Measurements: Height: 5\' 7"  (170.2 cm) Weight: 101.8 kg (224 lb 6.4 oz) IBW/kg (Calculated) : 66.1 Heparin Dosing Weight: 78 k  Vital Signs: Temp: 98.6 F (37 C) (07/13 0841) Temp Source: Oral (07/13 0841) BP: 122/90 (07/13 0841) Pulse Rate: 92 (07/13 0841)  Labs: Recent Labs    05/23/21 2051 05/23/21 2315 05/24/21 0337  HGB 16.6  --  17.2*  HCT 51.6  --  51.2  PLT 457*  --  481*  CREATININE 0.95  --  0.89  TROPONINIHS 9 10  --     Estimated Creatinine Clearance: 86.6 mL/min (by C-G formula based on SCr of 0.89 mg/dL).   Medical History: Past Medical History:  Diagnosis Date   Carpal tunnel syndrome    Cubital tunnel syndrome on right    Hypertension    states under control with meds., has been on med. x 5 yr.   Skin disease, fungal    on back   Wears partial dentures    upper   Assessment:  70 yr old male admitted last night with shortness of breath and leg swelling. CTA today shows submasssive PE with right heart strain. To begin IV heparin.  Lovenox 40 mg SQ Q24h previously ordered for VTE prophylaxis but dose had not yet been given.  Not on anticoagulants prior to admission.   Goal of Therapy:  Heparin level 0.3-0.7 units/ml Monitor platelets by anticoagulation protocol: Yes   Plan:  Discontinue Lovenox (none given) Give 5000 units bolus x 1 Start heparin infusion at 1350 units/hr Heparin level ~8 hrs after drip begins. Daily heparin level and CBC while on heparin. Monitor for bleeding.  Arty Baumgartner, [place] 05/24/2021,10:06 AM

## 2021-05-24 NOTE — Progress Notes (Signed)
Pt's O2 sat remaining around 84-88% on 5L [place]. Increased O2 to 6L without change. Changed pt to [place]ter [place] @9L  per O2 recommendation with sat increasing to 91-93%. Paged MD on call via Amion to notify.

## 2021-05-24 NOTE — Progress Notes (Signed)
Reviewed echo, checked back on patient, respiratory status improved, especially after his nap.  I think can do okay with heparin alone, will check lactate for reassurance.  If any decompensation would give systemic tpa.  Will check on tomorrow.  Erskine Emery MD PCCM

## 2021-05-24 NOTE — Progress Notes (Signed)
PT Cancellation Note  Patient Details Name: SAYID MOLL MRN: [id number] DOB: February 22, 1951   Cancelled Treatment:    Reason Eval/Treat Not Completed: Medical issues which prohibited therapy - Pt with acute PE, just started on heparin this am. Per rehab protocol, will initiate PT 24 hours post- starting heparin.   Stacie Glaze, PT DPT Acute Rehabilitation Services Pager [phone]  Office [phone]    Louis Matte 05/24/2021, 12:05 PM

## 2021-05-24 NOTE — Progress Notes (Signed)
Pt switched to NRB mask for transport to CT for CTA.

## 2021-05-25 ENCOUNTER — Inpatient Hospital Stay (HOSPITAL_COMMUNITY): Payer: Medicare Other

## 2021-05-25 DIAGNOSIS — R609 Edema, unspecified: Secondary | ICD-10-CM | POA: Diagnosis not present

## 2021-05-25 DIAGNOSIS — I700 Atherosclerosis of aorta: Secondary | ICD-10-CM | POA: Diagnosis not present

## 2021-05-25 DIAGNOSIS — R059 Cough, unspecified: Secondary | ICD-10-CM | POA: Diagnosis present

## 2021-05-25 DIAGNOSIS — E876 Hypokalemia: Secondary | ICD-10-CM | POA: Diagnosis present

## 2021-05-25 DIAGNOSIS — R9431 Abnormal electrocardiogram [ECG] [EKG]: Secondary | ICD-10-CM | POA: Diagnosis present

## 2021-05-25 DIAGNOSIS — R091 Pleurisy: Secondary | ICD-10-CM | POA: Diagnosis present

## 2021-05-25 DIAGNOSIS — R600 Localized edema: Secondary | ICD-10-CM | POA: Diagnosis present

## 2021-05-25 DIAGNOSIS — J9601 Acute respiratory failure with hypoxia: Secondary | ICD-10-CM | POA: Diagnosis not present

## 2021-05-25 DIAGNOSIS — E669 Obesity, unspecified: Secondary | ICD-10-CM | POA: Diagnosis present

## 2021-05-25 DIAGNOSIS — I2602 Saddle embolus of pulmonary artery with acute cor pulmonale: Secondary | ICD-10-CM | POA: Diagnosis not present

## 2021-05-25 DIAGNOSIS — I517 Cardiomegaly: Secondary | ICD-10-CM | POA: Diagnosis not present

## 2021-05-25 LAB — BASIC METABOLIC PANEL
Anion gap: 10 (ref 5–15)
BUN: 10 mg/dL (ref 8–23)
CO2: 34 mmol/L — ABNORMAL HIGH (ref 22–32)
Calcium: 8.8 mg/dL — ABNORMAL LOW (ref 8.9–10.3)
Chloride: 96 mmol/L — ABNORMAL LOW (ref 98–111)
Creatinine, Ser: 0.89 mg/dL (ref 0.61–1.24)
GFR, Estimated: >60 mL/min (ref >60)
Glucose, Bld: 98 mg/dL (ref 70–99)
Potassium: 3.3 mmol/L — ABNORMAL LOW (ref 3.5–5.1)
Sodium: 140 mmol/L (ref 135–145)

## 2021-05-25 LAB — CBC
HCT: 45.9 % (ref 39.0–52.0)
Hemoglobin: 15.3 g/dL (ref 13.0–17.0)
MCH: 32.7 pg (ref 26.0–34.0)
MCHC: 33.3 g/dL (ref 30.0–36.0)
MCV: 98.1 fL (ref 80.0–100.0)
Platelets: 458 10*3/uL — ABNORMAL HIGH (ref 150–400)
RBC: 4.68 MIL/uL (ref 4.22–5.81)
RDW: 14.4 % (ref 11.5–15.5)
WBC: 9.8 10*3/uL (ref 4.0–10.5)
nRBC: 0 % (ref 0.0–0.2)

## 2021-05-25 LAB — GLUCOSE, CAPILLARY: Glucose-Capillary: 136 mg/dL — ABNORMAL HIGH (ref 70–99)

## 2021-05-25 LAB — LACTIC ACID, PLASMA: Lactic Acid, Venous: 1.4 mmol/L (ref 0.5–1.9)

## 2021-05-25 LAB — ECHOCARDIOGRAM COMPLETE
Area-P 1/2: 2.62 cm2
Height: 67 in
S' Lateral: 2.65 cm
Weight: 3590.4 oz

## 2021-05-25 LAB — HEPARIN LEVEL (UNFRACTIONATED)
Heparin Unfractionated: 0.32 IU/mL (ref 0.30–0.70)
Heparin Unfractionated: 0.33 IU/mL (ref 0.30–0.70)
Heparin Unfractionated: 0.24 IU/mL — ABNORMAL LOW (ref 0.30–0.70)

## 2021-05-25 MED ORDER — HEPARIN BOLUS VIA INFUSION
1300.0000 [IU] | Freq: Once | INTRAVENOUS | Status: AC
  Administered 2021-05-25: 1300 [IU] via INTRAVENOUS
  Filled 2021-05-25: qty 1300

## 2021-05-25 MED ORDER — POTASSIUM CHLORIDE CRYS ER 20 MEQ PO TBCR
40.0000 meq | EXTENDED_RELEASE_TABLET | Freq: Every day | ORAL | Status: AC
  Administered 2021-05-25 – 2021-05-26 (×2): 40 meq via ORAL
  Filled 2021-05-25 (×2): qty 2

## 2021-05-25 MED ORDER — HYDROCOD POLST-CPM POLST ER 10-8 MG/5ML PO SUER
5.0000 mL | Freq: Two times a day (BID) | ORAL | Status: DC | PRN
  Administered 2021-05-25 – 2021-05-29 (×8): 5 mL via ORAL
  Filled 2021-05-25 (×8): qty 5

## 2021-05-25 NOTE — Progress Notes (Signed)
ANTICOAGULATION CONSULT NOTE - Follow Up Consult  Pharmacy Consult for Heparin Indication: pulmonary embolus and DVT RLE  No Known Allergies  Patient Measurements: Height: 5\' 7"  (170.2 cm) Weight: 102.7 kg (226 lb 6.4 oz) IBW/kg (Calculated) : 66.1 Heparin Dosing Weight: 78 kg  Vital Signs: Temp: 98.7 F (37.1 C) (07/14 1114) Temp Source: Oral (07/14 1114) BP: 124/87 (07/14 0800) Pulse Rate: 76 (07/14 1114)  Labs: Recent Labs    05/23/21 2051 05/23/21 2315 05/24/21 0337 05/24/21 1741 05/25/21 0334 05/25/21 1203  HGB 16.6  --  17.2*  --  15.3  --   HCT 51.6  --  51.2  --  45.9  --   PLT 457*  --  481*  --  458*  --   HEPARINUNFRC  --   --   --  0.11* 0.32 0.33  CREATININE 0.95  --  0.89  --  0.89  --   TROPONINIHS 9 10  --   --   --   --     Estimated Creatinine Clearance: 86.9 mL/min (by C-G formula based on SCr of 0.89 mg/dL).  Assessment:  70 yr old male admitted last night with shortness of breath and leg swelling. CTA 05/24/21 shows submasssive PE with right heart strain. Pharmacy consulted for IV heparin. Not on anticoagulants prior to admission. Duplex 7/14 shows RLE DVT of indeterminate age.   Heparin level low therapeutic 0.32 at 0334 and 0.33 at 12noon on heparin drip at 1600 units/hr.  CBC stable. No bleeding reported.   Goal of Therapy:  Heparin level 0.3-0.7 units/ml Monitor platelets by anticoagulation protocol: Yes   Plan:   Increase heparin drip to 1750 units/hr to try to keep level in goal range.  Will recheck heparin level ~8pm tonight.  Daily heparin level and CBC.  Arty Baumgartner, RPh 05/25/2021,1:26 PM

## 2021-05-25 NOTE — Progress Notes (Addendum)
NAME:  Alexander Robles, MRN:  [id number], DOB:  Feb 13, 1951, LOS: 1 ADMISSION DATE:  05/23/2021, CONSULTATION DATE:  05/24/21 REFERRING MD:  Arbutus Ped, CHIEF COMPLAINT:  sob   History of Present Illness:  70 year old man with hx of HTN admitted 7/12 with worsening DOE for past week.  Accompanied by cough and pleurisy 2 days  PTA. Additionally, he noted 1-2 months worsening lower ext edema that he attributes to poor mobility.  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  Workup in ER revealed possible CHF flare.  Subsequent workup showed elevated D dimer and CTA showing submassive PE.  PCCM consulted for consideration of advanced PE therapies.  No syncope, had one episode of feeling lightheaded when he over-exerted himself a couple days ago, thinks from breathlessness.  Pertinent  Medical History  HTN Osteoarthritis No history of strokes, bleeding issues, surgeries  Significant Hospital Events: Including procedures, antibiotic start and stop dates in addition to other pertinent events   7/12 admitted 7/13 PCCM consulted for evaluation of PE. Troponin 10, BNP 568. LA 2.2 7/13 CTA chest > acute PE with CT evidence of R heart strain c/w submassive PE 7/13 ECHO > LVEF 60-65%, no RWMA, moderate LVH, grade I diastolic dysfunction, RV systolic function moderately reduced. RV mildly enlarged, D-shaped interventricular septum suggestive of RV pressure overload, RA pressure 8  Interim History / Subjective:  Afebrile On 4L [place] O2  Pt reports ongoing coughing, took robitussin  but it is not helping with his cough.  Reports he was up all night and did not sleep. Denies hemoptysis, nose bleeding.  Reports clear/white sputum production.  Objective   Blood pressure 128/90, pulse 90, temperature 98.3 F (36.8 C), temperature source Oral, resp. rate 20, height 5\' 7"  (1.702 m), weight 102.7 kg, SpO2 94 %.        Intake/Output Summary (Last 24 hours) at  05/25/2021 0905 Last data filed at 05/25/2021 0743 Gross per 24 hour  Intake 1111 ml  Output 1375 ml  Net -264 ml   Filed Weights   05/23/21 2038 05/24/21 0318 05/25/21 0400  Weight: 109.3 kg 101.8 kg 102.7 kg    Examination: General: adult male lying in bed in NAD HEENT: MM pink/moist, [place] O2  Neuro: AAOx4, speech clear  CV: s1s2 RRR, no m/r/g PULM:  non-labored at rest, appears as if he is holding back coughing, clear on left, diminished on right GI: soft, bsx4 active  Extremities: warm/dry, trace LE edema  Skin: no rashes or lesions  Resolved Hospital Problem list      Assessment & Plan:   Acute submassive pulmonary embolism PESI 121 due to tachypnea, O2 need, age, male sex.  Class IV at higher risk of mortality.  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  He has no concerning clinical history and vitals are benign.  Troponin negative, lactate negative.  BNP pending.  No contraindications for thrombolytics.  Cough, Pleurisy From above and early infarct in RUL on CT  -continue heparin gtt per pharmacy  -ECHO as above  -will need lifelong anticoagulation -hold all antihypertensives, diuretics -no role currently for catheter directed or mechanical thromectomy at this time  -if change in hemodynamics / developed instability, consider systemic tPA -wean O2 for sats >92% -assess LE duplex   Best Practice (right click and "Reselect all SmartList  Selections" daily)  Per primary     Noe Gens, MSN, APRN, NP-C, AGACNP-BC [facility] 05/25/2021, 9:06 AM   Please see [email or web address] for pager details.   From 7A-7P if no response, please call [phone] After hours, please call ELink [phone]

## 2021-05-25 NOTE — Progress Notes (Signed)
Progress Note  Patient Name: Alexander Robles Date of Encounter: 05/25/2021  Primary Cardiologist: New to Dr. Radford Pax  Subjective   No events overnight; Echo done showing RV pressure overload and RV dysfunction.  Normal LVEF.  Has some cough.  Inpatient Medications    Scheduled Meds:  multivitamin with minerals  1 tablet Oral Daily   omega-3 acid ethyl esters  1 g Oral Daily   polyethylene glycol  17 g Oral Daily   potassium chloride  40 mEq Oral Daily   Continuous Infusions:  heparin 1,600 Units/hr (05/24/21 2302)   PRN Meds: acetaminophen, chlorpheniramine-HYDROcodone, oxyCODONE, tetrahydrozoline, traMADol   Vital Signs    Vitals:   05/25/21 0010 05/25/21 0400 05/25/21 0735 05/25/21 0800  BP: 100/73 128/90 116/86 124/87  Pulse:   90 80  Resp: 19 20    Temp: 98.7 F (37.1 C) 99 F (37.2 C) 98.3 F (36.8 C)   TempSrc: Oral Oral Oral   SpO2: 94% 94%    Weight:  102.7 kg    Height:        Intake/Output Summary (Last 24 hours) at 05/25/2021 0948 Last data filed at 05/25/2021 0743 Gross per 24 hour  Intake 1111 ml  Output 1375 ml  Net -264 ml   Filed Weights   05/23/21 2038 05/24/21 0318 05/25/21 0400  Weight: 109.3 kg 101.8 kg 102.7 kg    Telemetry    Sinus tachycardia-> SR - Personally Reviewed  ECG    No new since 05/24/21 - Personally Reviewed  Physical Exam   GEN: No acute distress.   Neck: No JVD  Cardiac: RRR, no murmurs, rubs, or gallops.  No RV Heave  Respiratory: Clear to auscultation bilaterally. GI: Soft, nontender, non-distended  MS: Trace edema; No deformity.  Neuro:  Nonfocal  Psych: Normal affect   Labs    Chemistry Recent Labs  Lab 05/23/21 2051 05/24/21 0337 05/25/21 0334  NA 136 138 140  K 3.3* 3.3* 3.3*  CL 92* 91* 96*  CO2 33* 36* 34*  GLUCOSE 111* 104* 98  BUN 10 9 10   CREATININE 0.95 0.89 0.89  CALCIUM 8.8* 8.8* 8.8*  PROT 7.1  --   --   ALBUMIN 3.3*  --   --   AST 25  --   --   ALT 24  --   --   ALKPHOS 80   --   --   BILITOT 1.4*  --   --   GFRNONAA >60 >60 >60  ANIONGAP 11 11 10      Hematology Recent Labs  Lab 05/23/21 2051 05/24/21 0337 05/25/21 0334  WBC 10.5 11.4* 9.8  RBC 5.16 5.26 4.68  HGB 16.6 17.2* 15.3  HCT 51.6 51.2 45.9  MCV 100.0 97.3 98.1  MCH 32.2 32.7 32.7  MCHC 32.2 33.6 33.3  RDW 14.4 14.4 14.4  PLT 457* 481* 458*    Cardiac EnzymesNo results for input(s): TROPONINI in the last 168 hours. No results for input(s): TROPIPOC in the last 168 hours.   BNP Recent Labs  Lab 05/23/21 2051 05/24/21 0337  BNP 614.0* 568.7*     DDimer  Recent Labs  Lab 05/24/21 0358  DDIMER 3.58*     Radiology    DG Chest 2 View  Result Date: 05/23/2021 CLINICAL DATA:  Shortness of breath which increases movement for 1 week, cough for 1 week, bilateral ankle swelling EXAM: CHEST - 2 VIEW COMPARISON:  Radiograph 08/25/2020 FINDINGS: Hazy perihilar and basilar predominant opacities present  in both lungs. Pulmonary vascular congestion and redistribution. Prominent cardiac silhouette, may be accentuated by AP technique. No visible layering effusion or discernible pneumothorax. No acute osseous or soft tissue abnormality. Degenerative changes are present in the imaged spine and shoulders. IMPRESSION: Hazy perihilar and basilar opacities, may reflect atelectasis or developing pulmonary edema with additional features which could suggest some degree heart failure or volume overload including vascular congestion and enlarged cardiac silhouette. Electronically Signed   By: Lovena Le M.D.   On: 05/23/2021 22:02   CT Angio Chest Pulmonary Embolism (PE) W or WO Contrast  Result Date: 05/24/2021 CLINICAL DATA:  Shortness of breath.  Pulmonary emboli suspected. EXAM: CT ANGIOGRAPHY CHEST WITH CONTRAST TECHNIQUE: Multidetector CT imaging of the chest was performed using the standard protocol during bolus administration of intravenous contrast. Multiplanar CT image reconstructions and MIPs were  obtained to evaluate the vascular anatomy. CONTRAST:  56mL OMNIPAQUE IOHEXOL 350 MG/ML SOLN COMPARISON:  Chest radiography same day FINDINGS: Cardiovascular: The heart is enlarged. Right ventricle is enlarged relative to the left, suggesting right ventricular strain. No pericardial effusion. Pulmonary arterial opacification is early but satisfactory. There is massive bilateral pulmonary embolism, more extensive on the right than the left. Mediastinum/Nodes: No mass or lymphadenopathy. Lungs/Pleura: Patchy density in both lungs that could be atelectasis or early pulmonary infarction. No pleural effusion. Upper Abdomen: Likely 1 cm cyst in the right lobe of the liver. Musculoskeletal: Ordinary thoracic degenerative changes. Review of the MIP images confirms the above findings. IMPRESSION: Positive for acute PE with CT evidence of right heart strain consistent with at least submassive (intermediate risk) PE. The presence of right heart strain has been associated with an increased risk of morbidity and mortality. Please refer to the "PE Focused" order set in EPIC. Call report in progress. Electronically Signed   By: Nelson Chimes M.D.   On: 05/24/2021 08:35   DG CHEST PORT 1 VIEW  Result Date: 05/24/2021 CLINICAL DATA:  70 year old male with shortness of breath. EXAM: PORTABLE CHEST 1 VIEW COMPARISON:  Chest radiographs 05/23/2021 and earlier. FINDINGS: Portable AP semi upright view at 0516 hours. Continued low lung volumes. Stable cardiac size and mediastinal contours. Questionable cardiomegaly. No pneumothorax or consolidation. Mildly increased interstitial markings in both lungs. Mild patchy opacity at the left lung base. No definite effusion. Negative visible bowel gas. No acute osseous abnormality identified. IMPRESSION: Continued low lung volumes. Increased interstitial markings and patchy left lung base opacity might simply reflect atelectasis, but mild or developing interstitial edema or infection are  difficult to exclude. Electronically Signed   By: Genevie Ann M.D.   On: 05/24/2021 05:39   ECHOCARDIOGRAM COMPLETE  Result Date: 05/25/2021    ECHOCARDIOGRAM REPORT   Patient Name:   Alexander Robles Doctor Date of Exam: 05/24/2021 Medical Rec #:  [id number]        Height:       67.0 in Accession #:    [id number]       Weight:       224.4 lb Date of Birth:  1951-05-21        BSA:          2.124 m Patient Age:    79 years         BP:           107/73 mmHg Patient Gender: M                HR:  75 bpm. Exam Location:  Inpatient Procedure: 2D Echo, Cardiac Doppler and Color Doppler                    STAT ECHO Reported to: Dr Aundra Dubin on 05/24/2021 12:01:00 PM. Indications:     I26.02 Pulmonary embolus  History:         Patient has no prior history of Echocardiogram examinations.                  Risk Factors:Hypertension.  Sonographer:     Jonelle Sidle Dance Referring Phys:  [id number] Kayleen Memos Diagnosing Phys: Loralie Champagne MD IMPRESSIONS  1. Left ventricular ejection fraction, by estimation, is 60 to 65%. The left ventricle has normal function. The left ventricle has no regional wall motion abnormalities. There is moderate left ventricular hypertrophy. Left ventricular diastolic parameters are consistent with Grade I diastolic dysfunction (impaired relaxation).  2. Right ventricular systolic function is moderately reduced. The right ventricular size is mildly enlarged. Tricuspid regurgitation signal is inadequate for assessing PA pressure. D-shaped interventricular septum suggestive of RV pressure/volume overload.  3. The mitral valve is normal in structure. No evidence of mitral valve regurgitation. No evidence of mitral stenosis.  4. The aortic valve is tricuspid. Aortic valve regurgitation is trivial. Mild aortic valve sclerosis is present, with no evidence of aortic valve stenosis.  5. Aortic dilatation noted. There is mild dilatation of the ascending aorta, measuring 41 mm.  6. The inferior vena cava is dilated in  size with >50% respiratory variability, suggesting right atrial pressure of 8 mmHg.  7. Technically difficult study, but there is evidence for RV strain on echo as above. FINDINGS  Left Ventricle: Left ventricular ejection fraction, by estimation, is 60 to 65%. The left ventricle has normal function. The left ventricle has no regional wall motion abnormalities. Definity contrast agent was given IV to delineate the left ventricular  endocardial borders. The left ventricular internal cavity size was normal in size. There is moderate left ventricular hypertrophy. Left ventricular diastolic parameters are consistent with Grade I diastolic dysfunction (impaired relaxation). Right Ventricle: The right ventricular size is mildly enlarged. No increase in right ventricular wall thickness. Right ventricular systolic function is moderately reduced. Tricuspid regurgitation signal is inadequate for assessing PA pressure. Left Atrium: Left atrial size was normal in size. Right Atrium: Right atrial size was normal in size. Pericardium: Trivial pericardial effusion is present. Mitral Valve: The mitral valve is normal in structure. Mild mitral annular calcification. No evidence of mitral valve regurgitation. No evidence of mitral valve stenosis. Tricuspid Valve: The tricuspid valve is not well visualized. Tricuspid valve regurgitation is not demonstrated. Aortic Valve: The aortic valve is tricuspid. Aortic valve regurgitation is trivial. Mild aortic valve sclerosis is present, with no evidence of aortic valve stenosis. Pulmonic Valve: The pulmonic valve was normal in structure. Pulmonic valve regurgitation is trivial. Aorta: Aortic dilatation noted. There is mild dilatation of the ascending aorta, measuring 41 mm. Venous: The inferior vena cava is dilated in size with greater than 50% respiratory variability, suggesting right atrial pressure of 8 mmHg. IAS/Shunts: No atrial level shunt detected by color flow Doppler.  LEFT VENTRICLE  PLAX 2D LVIDd:         3.80 cm  Diastology LVIDs:         2.65 cm  LV e' medial:    6.34 cm/s LV PW:         1.30 cm  LV E/e' medial:  5.9 LV IVS:  1.50 cm  LV e' lateral:   8.39 cm/s LVOT diam:     2.30 cm  LV E/e' lateral: 4.4 LV SV:         51 LV SV Index:   24 LVOT Area:     4.15 cm  RIGHT VENTRICLE             IVC RV Basal diam:  3.00 cm     IVC diam: 1.10 cm RV S prime:     15.50 cm/s TAPSE (M-mode): 2.3 cm LEFT ATRIUM             Index       RIGHT ATRIUM           Index LA diam:        3.70 cm 1.74 cm/m  RA Area:     17.00 cm LA Vol (A2C):   58.7 ml 27.64 ml/m RA Volume:   37.90 ml  17.85 ml/m LA Vol (A4C):   50.1 ml 23.59 ml/m LA Biplane Vol: 56.0 ml 26.37 ml/m  AORTIC VALVE LVOT Vmax:   82.80 cm/s LVOT Vmean:  52.500 cm/s LVOT VTI:    0.123 m  AORTA Ao Root diam: 3.80 cm Ao Asc diam:  4.10 cm MITRAL VALVE MV Area (PHT): 2.62 cm    SHUNTS MV Decel Time: 289 msec    Systemic VTI:  0.12 m MV E velocity: 37.30 cm/s  Systemic Diam: 2.30 cm MV A velocity: 70.80 cm/s MV E/A ratio:  0.53 Loralie Champagne MD Electronically signed by Loralie Champagne MD Signature Date/Time: 05/24/2021/12:46:10 PM    Final (Updated)     Patient Profile     70 y.o. male SOB found to have new PE  Assessment & Plan    Pulmonary Embolism with RV dysfunction and mean PA dilation (38 mm) HTN with presently normal pressures - PCCM reviewed with no plans for catheter directed intervention - at this time holding his norvasc; have a high threshold to diurese in the setting of acute sub massive PE with RV dysfunction - will need follow up echo once recovered from PE   Aortic atherosclerosis and CAC (LAD) HLD with Morbid Obesity - lipids for AM; LDL goal < 70  Potential 05/26/21 Sign off  For questions or updates, please contact [facility] Please consult [email or web address] for contact info under Cardiology/STEMI.      Signed, Werner Lean, MD  05/25/2021, 9:48 AM

## 2021-05-25 NOTE — Progress Notes (Signed)
Lower extremity venous has been completed.   Preliminary results in CV Proc.   Results given to RN.   Abram Sander 05/25/2021 10:35 AM

## 2021-05-25 NOTE — Progress Notes (Signed)
ANTICOAGULATION CONSULT NOTE - Follow Up Consult  Pharmacy Consult for IV Heparin Indication: pulmonary embolus  No Known Allergies  Patient Measurements: Height: 5\' 7"  (170.2 cm) Weight: 102.7 kg (226 lb 6.4 oz) IBW/kg (Calculated) : 66.1 Heparin Dosing Weight:  88.4 kg  Vital Signs: Temp: 98.3 F (36.8 C) (07/14 1900) Temp Source: Oral (07/14 1900) BP: 109/77 (07/14 1900) Pulse Rate: 80 (07/14 1530)  Labs: Recent Labs    05/23/21 2051 05/23/21 2315 05/24/21 0337 05/24/21 1741 05/25/21 0334 05/25/21 1203 05/25/21 1954  HGB 16.6  --  17.2*  --  15.3  --   --   HCT 51.6  --  51.2  --  45.9  --   --   PLT 457*  --  481*  --  458*  --   --   HEPARINUNFRC  --   --   --    < > 0.32 0.33 0.24*  CREATININE 0.95  --  0.89  --  0.89  --   --   TROPONINIHS 9 10  --   --   --   --   --    < > = values in this interval not displayed.    Estimated Creatinine Clearance: 86.9 mL/min (by C-G formula based on SCr of 0.89 mg/dL).   Medical History: Past Medical History:  Diagnosis Date   Carpal tunnel syndrome    Cubital tunnel syndrome on right    Hypertension    states under control with meds., has been on med. x 5 yr.   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 CTA today showed submasssive PE with right heart strain. Pharmacy was consulted to dose IV heparin. Pt had Lovenox 40 mg SQ Q 24 hr previously ordered for VTE prophylaxis, but dose had not yet been given. Pt was not on anticoagulant prior to admission.  Heparin level ~6 hrs after heparin infusion was increased to 1750 units/hr was 0.24 units/ml, which is below the goal range for this pt. H/H 15.3/45.9, plt 458. Per RN, no issues with IV or bleeding observed.  Goal of Therapy:  Heparin level 0.3-0.7 units/ml Monitor platelets by anticoagulation protocol: Yes   Plan:  Heparin 1300 units IV bolus X  1 Increase heparin infusion to 1900 units/hr Check heparin level in ~7 hrs Monitor daily heparin level and CBC while on heparin Monitor for bleeding F/U transition to oral anticoagulant when able  Gillermina Hu, PharmD, BCPS, [facility] Clinical Pharmacist 05/25/2021,9:53 PM

## 2021-05-25 NOTE — Progress Notes (Signed)
[place] for IV Heparin Indication: pulmonary embolus  No Known Allergies  Patient Measurements: Height: 5\' 7"  (170.2 cm) Weight: 102.7 kg (226 lb 6.4 oz) IBW/kg (Calculated) : 66.1 Heparin Dosing Weight:  88.4 kg  Vital Signs: Temp: 99 F (37.2 C) (07/14 0400) Temp Source: Oral (07/14 0400) BP: 128/90 (07/14 0400)  Labs: Recent Labs    05/23/21 2051 05/23/21 2315 05/24/21 0337 05/24/21 1741 05/25/21 0334  HGB 16.6  --  17.2*  --  15.3  HCT 51.6  --  51.2  --  45.9  PLT 457*  --  481*  --  458*  HEPARINUNFRC  --   --   --  0.11* 0.32  CREATININE 0.95  --  0.89  --   --   TROPONINIHS 9 10  --   --   --     Estimated Creatinine Clearance: 86.9 mL/min (by C-G formula based on SCr of 0.89 mg/dL).   Medical History: Past Medical History:  Diagnosis Date   Carpal tunnel syndrome    Cubital tunnel syndrome on right    Hypertension    states under control with meds., has been on med. x 5 yr.   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 CTA today showed submasssive PE with right heart strain. Pharmacy was consulted to dose IV heparin. Pt had Lovenox 40 mg SQ Q 24 hr previously ordered for VTE prophylaxis, but dose had not yet been given. Pt was not on anticoagulant prior to admission.  Initial heparin level ~7 hrs after heparin 5000 units IV bolus X 1, followed by heparin infusion at 1350 units/hr was 0.11 units/ml, which is below the goal range for this pt. H/H 17.2/51.2, plt 481. Per RN, no issues with IV or bleeding observed.  7/14 AM update:  Heparin level therapeutic after rate increase Hgb 15.3  Goal of Therapy:  Heparin level 0.3-0.7 units/ml Monitor platelets by anticoagulation protocol: Yes   Plan:  Cont heparin 1600 units/hr 1200 heparin level  Narda Bonds, PharmD, BCPS Clinical  Pharmacist Phone: [phone]

## 2021-05-25 NOTE — Progress Notes (Signed)
OT Cancellation Note  Patient Details Name: Alexander Robles MRN: [id number] DOB: [birth year]   Cancelled Treatment:    Reason Eval/Treat Not Completed: Medical issues which prohibited therapy. Per RN session held due to concerns for LE DVTs. OT will continue to follow.  Minus Breeding, MSOT, OTR/L  [facility]  [phone]   Alexander Robles 05/25/2021, 11:28 AM

## 2021-05-25 NOTE — Plan of Care (Signed)

## 2021-05-25 NOTE — Progress Notes (Signed)
PROGRESS NOTE    Alexander Robles   EYC:[id number]  DOB: [birth year]  PCP: Wenda Low, MD    DOA: 05/23/2021 LOS: 1   Assessment & Plan   Principal Problem:   Acute saddle pulmonary embolism ([place]) Active Problems:   Acute CHF (congestive heart failure) ([place])   Acute respiratory failure with hypoxia (HCC)   SOB (shortness of breath)   Right ventricular enlargement   Acute hypoxic respiratory failure secondary to acute submassive pulmonary embolism Patient reports sedentary lifestyle.  No known malignancies. Hemodynamically stable, O2 needs stable.   Continue Heparin drip. PCCM consulted - see their recs. Transition to [place] prior to d/c. Supplement o2 for sats above 90%.  Cough due to PE.   Scheduled Mucinex, PRN Tussionex, PRN Tessalon  Right Lower Extremity DVT - seen on LE doppler U/S. Anticoagulate as above. Mobilize as tolerated.  Chronic diastolic CHF - mildly decompensated on admission due to acute PE with right heart strain. Presented with dyspnea x 1 week, b/l lower extremity edema, elevated BNP, pulmonary edema on CXR. Treated with IV Lasix in the ED, continued briefly on admision but stopped once PE diagnosed. Strict I/O's, daily weights. Cardiology consulted.   Hypokalemia Replaced.  Monitor, replace PRN for goal K>4   Obesity Body mass index is 35.76 kg/m. Complicates overall care and prognosis.  Recommend lifestyle modifications including physical activity and diet for weight loss and overall long-term health.    Bilateral lower extremity edema Self-reported sedentary lifestyle sitting for 7 to 8 hours/day   Prolonged QTC QTC from admission twelve-lead EKG 505 Avoid QTC prolonging agents Optimize magnesium and potassium levels.    Patient BMI: Body mass index is 35.46 kg/m.   DVT prophylaxis: SCDs Start: 05/24/21 0119   Diet:  Diet Orders (From admission, onward)     Start     Ordered   05/24/21 0226  Diet Heart Room service  appropriate? Yes; Fluid consistency: Thin; Fluid restriction: 1800 mL Fluid  Diet effective now       Question Answer Comment  Room service appropriate? Yes   Fluid consistency: Thin   Fluid restriction: 1800 mL Fluid      05/24/21 0225              Code Status: Full Code   Brief Narrative / Hospital Course to Date:   70 y.o. male with medical history significant for essential hypertension, who presented to [facility] ED with complaints of gradually worsening shortness of breath in the last few days, associated with non productive cough and bilateral lower extremity edema which he noted about 2 months ago, improved after stopping alcohol use 3 weeks ago.  Admits to a sedentary lifestyle sitting for 7-8 hours per day due to chronic bilateral knee pain.    He was hypoxic on arrival to the ED and ultimately found to have an acute submassive PE on CTA chest.   Patient admitted to [facility].   Started on heparin drip. Pulmonology consulted.   Patient has been hemodynamically stable and clinically improving with anticoagulation.  No plan for thrombolytics at this time.   Subjective 05/25/21    Pt seen up in recliner after working with therapy.  Reports still SOB with exertion but improved.  Continues to have cough which is his only complaint.  Added Mucinex, pt reports it works well for him.   Disposition Plan & Communication   Status is: Inpatient  Remains inpatient appropriate because:IV treatments appropriate due to intensity of illness or inability  to take PO  Dispo:  Patient From: Home  Planned Disposition: Home  Medically stable for discharge: No      Consults, Procedures, Significant Events   Consultants:  Pulmonology Cardiology  Procedures:  None  Antimicrobials:  Anti-infectives (From admission, onward)    None         Micro    Objective   Vitals:   05/25/21 0735 05/25/21 0800 05/25/21 1114 05/25/21 1530  BP: 116/86 124/87 126/90 132/81  Pulse: 90 80 76 80   Resp: (!) 24 (!) 25 (!) 22 10  Temp: 98.3 F (36.8 C) 97.6 F (36.4 C) 98.7 F (37.1 C) 98 F (36.7 C)  TempSrc: Oral Oral Oral Oral  SpO2: 92% 92% 90% (!) 86%  Weight:      Height:        Intake/Output Summary (Last 24 hours) at 05/25/2021 1626 Last data filed at 05/25/2021 1102 Gross per 24 hour  Intake 1111 ml  Output 875 ml  Net 236 ml   Filed Weights   05/23/21 2038 05/24/21 0318 05/25/21 0400  Weight: 109.3 kg 101.8 kg 102.7 kg    Physical Exam:  General exam: sleeping comfortably, woke easily to voice, no acute distress, obese Respiratory system: CTAB, tachypneic, no wheezes, normal respiratory effort. Cardiovascular system: normal S1/S2, RRR, trace LE edema.   Central nervous system: no gross focal neurologic deficits, normal speech Extremities moves all, b/l LE edema improved, normal tone  Labs   Data Reviewed: I have personally reviewed following labs and imaging studies  CBC: Recent Labs  Lab 05/23/21 2051 05/24/21 0337 05/25/21 0334  WBC 10.5 11.4* 9.8  NEUTROABS 8.2*  --   --   HGB 16.6 17.2* 15.3  HCT 51.6 51.2 45.9  MCV 100.0 97.3 98.1  PLT 457* 481* 315*   Basic Metabolic Panel: Recent Labs  Lab 05/23/21 2051 05/24/21 0337 05/25/21 0334  NA 136 138 140  K 3.3* 3.3* 3.3*  CL 92* 91* 96*  CO2 33* 36* 34*  GLUCOSE 111* 104* 98  BUN 10 9 10   CREATININE 0.95 0.89 0.89  CALCIUM 8.8* 8.8* 8.8*  MG  --  2.1  --   PHOS  --  4.0  --    GFR: Estimated Creatinine Clearance: 86.9 mL/min (by C-G formula based on SCr of 0.89 mg/dL). Liver Function Tests: Recent Labs  Lab 05/23/21 2051  AST 25  ALT 24  ALKPHOS 80  BILITOT 1.4*  PROT 7.1  ALBUMIN 3.3*   No results for input(s): LIPASE, AMYLASE in the last 168 hours. No results for input(s): AMMONIA in the last 168 hours. Coagulation Profile: No results for input(s): INR, PROTIME in the last 168 hours. Cardiac Enzymes: No results for input(s): CKTOTAL, CKMB, CKMBINDEX, TROPONINI in the  last 168 hours. BNP (last 3 results) No results for input(s): PROBNP in the last 8760 hours. HbA1C: No results for input(s): HGBA1C in the last 72 hours. CBG: No results for input(s): GLUCAP in the last 168 hours. Lipid Profile: No results for input(s): CHOL, HDL, LDLCALC, TRIG, CHOLHDL, LDLDIRECT in the last 72 hours. Thyroid Function Tests: No results for input(s): TSH, T4TOTAL, FREET4, T3FREE, THYROIDAB in the last 72 hours. Anemia Panel: No results for input(s): VITAMINB12, FOLATE, FERRITIN, TIBC, IRON, RETICCTPCT in the last 72 hours. Sepsis Labs: Recent Labs  Lab 05/23/21 2057 05/24/21 1510 05/24/21 1741 05/25/21 1203  LATICACIDVEN 1.8 2.2* 2.2* 1.4    Recent Results (from the past 240 hour(s))  Blood culture (routine  x 2)     Status: None (Preliminary result)   Collection Time: 05/23/21  9:00 PM   Specimen: BLOOD  Result Value Ref Range Status   Specimen Description BLOOD LEFT ANTECUBITAL  Final   Special Requests   Final    BOTTLES DRAWN AEROBIC AND ANAEROBIC Blood Culture results may not be optimal due to an inadequate volume of blood received in culture bottles   Culture   Final    NO GROWTH 2 DAYS Performed at [facility], [place] [address], [place], [place] [address]    Report Status PENDING  Incomplete  Blood culture (routine x 2)     Status: None (Preliminary result)   Collection Time: 05/23/21  9:07 PM   Specimen: BLOOD LEFT HAND  Result Value Ref Range Status   Specimen Description BLOOD LEFT HAND  Final   Special Requests   Final    BOTTLES DRAWN AEROBIC AND ANAEROBIC Blood Culture results may not be optimal due to an inadequate volume of blood received in culture bottles   Culture   Final    NO GROWTH 2 DAYS Performed at [facility], [place] [address], [place], [place] [address]    Report Status PENDING  Incomplete  Resp Panel by RT-PCR (Flu A&B, Covid) Nasopharyngeal Swab     Status: None   Collection Time: 05/24/21 12:33 AM   Specimen:  Nasopharyngeal Swab; Nasopharyngeal(NP) swabs in vial transport medium  Result Value Ref Range Status   SARS Coronavirus 2 by RT PCR NEGATIVE NEGATIVE Final    Comment: (NOTE) SARS-CoV-2 target nucleic acids are NOT DETECTED.  The SARS-CoV-2 RNA is generally detectable in upper respiratory specimens during the acute phase of infection. The lowest concentration of SARS-CoV-2 viral copies this assay can detect is 138 copies/mL. A negative result does not preclude SARS-Cov-2 infection and should not be used as the sole basis for treatment or other patient management decisions. A negative result may occur with  improper specimen collection/handling, submission of specimen other than nasopharyngeal swab, presence of viral mutation(s) within the areas targeted by this assay, and inadequate number of viral copies(<138 copies/mL). A negative result must be combined with clinical observations, patient history, and epidemiological information. The expected result is Negative.  Fact Sheet for Patients:  EntrepreneurPulse.com.au  Fact Sheet for Healthcare Providers:  IncredibleEmployment.be  This test is no t yet approved or cleared by the [place] FDA and  has been authorized for detection and/or diagnosis of SARS-CoV-2 by FDA under an Emergency Use Authorization (EUA). This EUA will remain  in effect (meaning this test can be used) for the duration of the COVID-19 declaration under Section 564(b)(1) of the Act, 21 U.S.C.section 360bbb-3(b)(1), unless the authorization is terminated  or revoked sooner.       Influenza A by PCR NEGATIVE NEGATIVE Final   Influenza B by PCR NEGATIVE NEGATIVE Final    Comment: (NOTE) The Xpert Xpress SARS-CoV-2/FLU/RSV plus assay is intended as an aid in the diagnosis of influenza from Nasopharyngeal swab specimens and should not be used as a sole basis for treatment. Nasal washings and aspirates are unacceptable for  Xpert Xpress SARS-CoV-2/FLU/RSV testing.  Fact Sheet for Patients: EntrepreneurPulse.com.au  Fact Sheet for Healthcare Providers: IncredibleEmployment.be  This test is not yet approved or cleared by the [place] FDA and has been authorized for detection and/or diagnosis of SARS-CoV-2 by FDA under an Emergency Use Authorization (EUA). This EUA will remain in effect (meaning this test can be  used) for the duration of the COVID-19 declaration under Section 564(b)(1) of the Act, 21 U.S.C. section 360bbb-3(b)(1), unless the authorization is terminated or revoked.  Performed at [facility], [place] [address] [place], [place] [address]       Imaging Studies   DG Chest 2 View  Result Date: 05/23/2021 CLINICAL DATA:  Shortness of breath which increases movement for 1 week, cough for 1 week, bilateral ankle swelling EXAM: CHEST - 2 VIEW COMPARISON:  Radiograph 08/25/2020 FINDINGS: Hazy perihilar and basilar predominant opacities present in both lungs. Pulmonary vascular congestion and redistribution. Prominent cardiac silhouette, may be accentuated by AP technique. No visible layering effusion or discernible pneumothorax. No acute osseous or soft tissue abnormality. Degenerative changes are present in the imaged spine and shoulders. IMPRESSION: Hazy perihilar and basilar opacities, may reflect atelectasis or developing pulmonary edema with additional features which could suggest some degree heart failure or volume overload including vascular congestion and enlarged cardiac silhouette. Electronically Signed   By: Lovena Le M.D.   On: 05/23/2021 22:02   CT Angio Chest Pulmonary Embolism (PE) W or WO Contrast  Result Date: 05/24/2021 CLINICAL DATA:  Shortness of breath.  Pulmonary emboli suspected. EXAM: CT ANGIOGRAPHY CHEST WITH CONTRAST TECHNIQUE: Multidetector CT imaging of the chest was performed using the standard protocol during bolus  administration of intravenous contrast. Multiplanar CT image reconstructions and MIPs were obtained to evaluate the vascular anatomy. CONTRAST:  31mL OMNIPAQUE IOHEXOL 350 MG/ML SOLN COMPARISON:  Chest radiography same day FINDINGS: Cardiovascular: The heart is enlarged. Right ventricle is enlarged relative to the left, suggesting right ventricular strain. No pericardial effusion. Pulmonary arterial opacification is early but satisfactory. There is massive bilateral pulmonary embolism, more extensive on the right than the left. Mediastinum/Nodes: No mass or lymphadenopathy. Lungs/Pleura: Patchy density in both lungs that could be atelectasis or early pulmonary infarction. No pleural effusion. Upper Abdomen: Likely 1 cm cyst in the right lobe of the liver. Musculoskeletal: Ordinary thoracic degenerative changes. Review of the MIP images confirms the above findings. IMPRESSION: Positive for acute PE with CT evidence of right heart strain consistent with at least submassive (intermediate risk) PE. The presence of right heart strain has been associated with an increased risk of morbidity and mortality. Please refer to the "PE Focused" order set in EPIC. Call report in progress. Electronically Signed   By: Nelson Chimes M.D.   On: 05/24/2021 08:35   DG CHEST PORT 1 VIEW  Result Date: 05/24/2021 CLINICAL DATA:  70 year old male with shortness of breath. EXAM: PORTABLE CHEST 1 VIEW COMPARISON:  Chest radiographs 05/23/2021 and earlier. FINDINGS: Portable AP semi upright view at 0516 hours. Continued low lung volumes. Stable cardiac size and mediastinal contours. Questionable cardiomegaly. No pneumothorax or consolidation. Mildly increased interstitial markings in both lungs. Mild patchy opacity at the left lung base. No definite effusion. Negative visible bowel gas. No acute osseous abnormality identified. IMPRESSION: Continued low lung volumes. Increased interstitial markings and patchy left lung base opacity might  simply reflect atelectasis, but mild or developing interstitial edema or infection are difficult to exclude. Electronically Signed   By: Genevie Ann M.D.   On: 05/24/2021 05:39   ECHOCARDIOGRAM COMPLETE  Result Date: 05/25/2021    ECHOCARDIOGRAM REPORT   Patient Name:   Alexander Robles Date of Exam: 05/24/2021 Medical Rec #:  [id number]        Height:       67.0 in Accession #:    [id number]  Weight:       224.4 lb Date of Birth:  1951/03/02        BSA:          2.124 m Patient Age:    81 years         BP:           107/73 mmHg Patient Gender: M                HR:           75 bpm. Exam Location:  Inpatient Procedure: 2D Echo, Cardiac Doppler and Color Doppler                    STAT ECHO Reported to: Dr Aundra Dubin on 05/24/2021 12:01:00 PM. Indications:     I26.02 Pulmonary embolus  History:         Patient has no prior history of Echocardiogram examinations.                  Risk Factors:Hypertension.  Sonographer:     Jonelle Sidle Dance Referring Phys:  [id number] Kayleen Memos Diagnosing Phys: Loralie Champagne MD IMPRESSIONS  1. Left ventricular ejection fraction, by estimation, is 60 to 65%. The left ventricle has normal function. The left ventricle has no regional wall motion abnormalities. There is moderate left ventricular hypertrophy. Left ventricular diastolic parameters are consistent with Grade I diastolic dysfunction (impaired relaxation).  2. Right ventricular systolic function is moderately reduced. The right ventricular size is mildly enlarged. Tricuspid regurgitation signal is inadequate for assessing PA pressure. D-shaped interventricular septum suggestive of RV pressure/volume overload.  3. The mitral valve is normal in structure. No evidence of mitral valve regurgitation. No evidence of mitral stenosis.  4. The aortic valve is tricuspid. Aortic valve regurgitation is trivial. Mild aortic valve sclerosis is present, with no evidence of aortic valve stenosis.  5. Aortic dilatation noted. There is mild  dilatation of the ascending aorta, measuring 41 mm.  6. The inferior vena cava is dilated in size with >50% respiratory variability, suggesting right atrial pressure of 8 mmHg.  7. Technically difficult study, but there is evidence for RV strain on echo as above. FINDINGS  Left Ventricle: Left ventricular ejection fraction, by estimation, is 60 to 65%. The left ventricle has normal function. The left ventricle has no regional wall motion abnormalities. Definity contrast agent was given IV to delineate the left ventricular  endocardial borders. The left ventricular internal cavity size was normal in size. There is moderate left ventricular hypertrophy. Left ventricular diastolic parameters are consistent with Grade I diastolic dysfunction (impaired relaxation). Right Ventricle: The right ventricular size is mildly enlarged. No increase in right ventricular wall thickness. Right ventricular systolic function is moderately reduced. Tricuspid regurgitation signal is inadequate for assessing PA pressure. Left Atrium: Left atrial size was normal in size. Right Atrium: Right atrial size was normal in size. Pericardium: Trivial pericardial effusion is present. Mitral Valve: The mitral valve is normal in structure. Mild mitral annular calcification. No evidence of mitral valve regurgitation. No evidence of mitral valve stenosis. Tricuspid Valve: The tricuspid valve is not well visualized. Tricuspid valve regurgitation is not demonstrated. Aortic Valve: The aortic valve is tricuspid. Aortic valve regurgitation is trivial. Mild aortic valve sclerosis is present, with no evidence of aortic valve stenosis. Pulmonic Valve: The pulmonic valve was normal in structure. Pulmonic valve regurgitation is trivial. Aorta: Aortic dilatation noted. There is mild dilatation of the ascending aorta, measuring 41 mm. Venous:  The inferior vena cava is dilated in size with greater than 50% respiratory variability, suggesting right atrial pressure  of 8 mmHg. IAS/Shunts: No atrial level shunt detected by color flow Doppler.  LEFT VENTRICLE PLAX 2D LVIDd:         3.80 cm  Diastology LVIDs:         2.65 cm  LV e' medial:    6.34 cm/s LV PW:         1.30 cm  LV E/e' medial:  5.9 LV IVS:        1.50 cm  LV e' lateral:   8.39 cm/s LVOT diam:     2.30 cm  LV E/e' lateral: 4.4 LV SV:         51 LV SV Index:   24 LVOT Area:     4.15 cm  RIGHT VENTRICLE             IVC RV Basal diam:  3.00 cm     IVC diam: 1.10 cm RV S prime:     15.50 cm/s TAPSE (M-mode): 2.3 cm LEFT ATRIUM             Index       RIGHT ATRIUM           Index LA diam:        3.70 cm 1.74 cm/m  RA Area:     17.00 cm LA Vol (A2C):   58.7 ml 27.64 ml/m RA Volume:   37.90 ml  17.85 ml/m LA Vol (A4C):   50.1 ml 23.59 ml/m LA Biplane Vol: 56.0 ml 26.37 ml/m  AORTIC VALVE LVOT Vmax:   82.80 cm/s LVOT Vmean:  52.500 cm/s LVOT VTI:    0.123 m  AORTA Ao Root diam: 3.80 cm Ao Asc diam:  4.10 cm MITRAL VALVE MV Area (PHT): 2.62 cm    SHUNTS MV Decel Time: 289 msec    Systemic VTI:  0.12 m MV E velocity: 37.30 cm/s  Systemic Diam: 2.30 cm MV A velocity: 70.80 cm/s MV E/A ratio:  0.53 Loralie Champagne MD Electronically signed by Loralie Champagne MD Signature Date/Time: 05/24/2021/12:46:10 PM    Final (Updated)    VAS [place] LOWER EXTREMITY VENOUS (DVT)  Result Date: 05/25/2021  Lower Venous DVT Study Patient Name:  Alexander Robles  Date of Exam:   05/25/2021 Medical Rec #: [id number]         Accession #:    [id number] Date of Birth: 1951-05-05         Patient Gender: M Patient Age:   69Y Exam Location:  [facility] Procedure:      VAS [place] LOWER EXTREMITY VENOUS (DVT) Referring Phys: [id number] [place] --------------------------------------------------------------------------------  Indications: Edema.  Comparison Study: no prior Performing Technologist: Archie Patten RVS  Examination Guidelines: A complete evaluation includes B-mode imaging, spectral Doppler, color Doppler, and power Doppler as needed  of all accessible portions of each vessel. Bilateral testing is considered an integral part of a complete examination. Limited examinations for reoccurring indications may be performed as noted. The reflux portion of the exam is performed with the patient in reverse Trendelenburg.  +---------+---------------+---------+-----------+----------+-----------------+ RIGHT    CompressibilityPhasicitySpontaneityPropertiesThrombus Aging    +---------+---------------+---------+-----------+----------+-----------------+ CFV      Partial        Yes      Yes                  Age Indeterminate +---------+---------------+---------+-----------+----------+-----------------+ SFJ  Full                                                           +---------+---------------+---------+-----------+----------+-----------------+ FV Prox  None                                         Age Indeterminate +---------+---------------+---------+-----------+----------+-----------------+ FV Mid   None                                         Age Indeterminate +---------+---------------+---------+-----------+----------+-----------------+ FV DistalNone                                         Age Indeterminate +---------+---------------+---------+-----------+----------+-----------------+ PFV      Full                                                           +---------+---------------+---------+-----------+----------+-----------------+ POP      None           No       No                   Age Indeterminate +---------+---------------+---------+-----------+----------+-----------------+ PTV      Partial                                      Age Indeterminate +---------+---------------+---------+-----------+----------+-----------------+ PERO     None                                         Age Indeterminate +---------+---------------+---------+-----------+----------+-----------------+    +---------+---------------+---------+-----------+----------+--------------+ LEFT     CompressibilityPhasicitySpontaneityPropertiesThrombus Aging +---------+---------------+---------+-----------+----------+--------------+ CFV      Full           Yes      Yes                                 +---------+---------------+---------+-----------+----------+--------------+ SFJ      Full                                                        +---------+---------------+---------+-----------+----------+--------------+ FV Prox  Full                                                        +---------+---------------+---------+-----------+----------+--------------+  FV Mid   Full                                                        +---------+---------------+---------+-----------+----------+--------------+ FV DistalFull                                                        +---------+---------------+---------+-----------+----------+--------------+ PFV      Full                                                        +---------+---------------+---------+-----------+----------+--------------+ POP      Full           Yes      Yes                                 +---------+---------------+---------+-----------+----------+--------------+ PTV      Full                                                        +---------+---------------+---------+-----------+----------+--------------+ PERO     Full                                                        +---------+---------------+---------+-----------+----------+--------------+     Summary: RIGHT: - Findings consistent with age indeterminate deep vein thrombosis involving the right common femoral vein, right femoral vein, right popliteal vein, right posterior tibial veins, and right peroneal veins. - A cystic structure is found in the popliteal fossa.  LEFT: - There is no evidence of deep vein thrombosis in the lower  extremity.  - No cystic structure found in the popliteal fossa.  *See table(s) above for measurements and observations.    Preliminary      Medications   Scheduled Meds:  multivitamin with minerals  1 tablet Oral Daily   omega-3 acid ethyl esters  1 g Oral Daily   polyethylene glycol  17 g Oral Daily   potassium chloride  40 mEq Oral Daily   Continuous Infusions:  heparin 1,750 Units/hr (05/25/21 1536)       LOS: 1 day    Time spent: 25 minutes with > 50% spent at bedside and in coordination of care.    Ezekiel Slocumb, DO [facility]  05/25/2021, 4:26 PM      If 7PM-7AM, please contact night-coverage. How to contact the [facility] Attending or Consulting provider [place] or covering provider during after hours [place], for this patient?    Check the care team in [facility] and look for a) attending/consulting [facility] provider listed and b) the [facility] team  listed Log into [email or web address] and use 's universal password to access. If you do not have the password, please contact the hospital operator. Locate the [facility] provider you are looking for under [facility] and page to a number that you can be directly reached. If you still have difficulty reaching the provider, please page the [facility] (Director on Call) for the Hospitalists listed on amion for assistance.

## 2021-05-25 NOTE — Progress Notes (Signed)
PT Cancellation Note  Patient Details Name: Alexander Robles MRN: [id number] DOB: August 29, 1951   Cancelled Treatment:    Reason Eval/Treat Not Completed: Medical issues which prohibited therapy.  Talked with nursing and pt has a considerable RLE DVT, will retry tomorrow.   Ramond Dial 05/25/2021, 12:33 PM  Mee Hives, PT MS Acute Rehab Dept. Number: [place]

## 2021-05-26 DIAGNOSIS — J9601 Acute respiratory failure with hypoxia: Secondary | ICD-10-CM | POA: Diagnosis not present

## 2021-05-26 DIAGNOSIS — I2602 Saddle embolus of pulmonary artery with acute cor pulmonale: Secondary | ICD-10-CM | POA: Diagnosis not present

## 2021-05-26 LAB — LIPID PANEL
Cholesterol: 142 mg/dL (ref 0–200)
HDL: 42 mg/dL (ref >40)
LDL Cholesterol: 83 mg/dL (ref 0–99)
Total CHOL/HDL Ratio: 3.4 RATIO
Triglycerides: 87 mg/dL (ref <150)
VLDL: 17 mg/dL (ref 0–40)

## 2021-05-26 LAB — CBC
HCT: 46 % (ref 39.0–52.0)
Hemoglobin: 15.2 g/dL (ref 13.0–17.0)
MCH: 32.9 pg (ref 26.0–34.0)
MCHC: 33 g/dL (ref 30.0–36.0)
MCV: 99.6 fL (ref 80.0–100.0)
Platelets: 468 10*3/uL — ABNORMAL HIGH (ref 150–400)
RBC: 4.62 MIL/uL (ref 4.22–5.81)
RDW: 14.3 % (ref 11.5–15.5)
WBC: 9.9 10*3/uL (ref 4.0–10.5)
nRBC: 0 % (ref 0.0–0.2)

## 2021-05-26 LAB — BASIC METABOLIC PANEL
Anion gap: 7 (ref 5–15)
BUN: 10 mg/dL (ref 8–23)
CO2: 30 mmol/L (ref 22–32)
Calcium: 8.9 mg/dL (ref 8.9–10.3)
Chloride: 99 mmol/L (ref 98–111)
Creatinine, Ser: 0.84 mg/dL (ref 0.61–1.24)
GFR, Estimated: >60 mL/min (ref >60)
Glucose, Bld: 99 mg/dL (ref 70–99)
Potassium: 3.8 mmol/L (ref 3.5–5.1)
Sodium: 136 mmol/L (ref 135–145)

## 2021-05-26 LAB — HEPARIN LEVEL (UNFRACTIONATED)
Heparin Unfractionated: 0.59 IU/mL (ref 0.30–0.70)
Heparin Unfractionated: 0.54 IU/mL (ref 0.30–0.70)

## 2021-05-26 MED ORDER — MENTHOL 3 MG MT LOZG
1.0000 | LOZENGE | OROMUCOSAL | Status: DC | PRN
  Administered 2021-05-28 – 2021-05-29 (×4): 3 mg via ORAL
  Filled 2021-05-26 (×3): qty 9

## 2021-05-26 MED ORDER — DM-GUAIFENESIN ER 30-600 MG PO TB12
1.0000 | ORAL_TABLET | Freq: Two times a day (BID) | ORAL | Status: DC
  Administered 2021-05-26 – 2021-05-29 (×7): 1 via ORAL
  Filled 2021-05-26 (×7): qty 1

## 2021-05-26 MED ORDER — BENZONATATE 100 MG PO CAPS
200.0000 mg | ORAL_CAPSULE | Freq: Three times a day (TID) | ORAL | Status: DC | PRN
  Administered 2021-05-26 – 2021-05-29 (×5): 200 mg via ORAL
  Filled 2021-05-26 (×5): qty 2

## 2021-05-26 NOTE — Evaluation (Signed)
Occupational Therapy Evaluation Patient Details Name: Alexander Robles MRN: [id number] DOB: 06/19/1951 Today's Date: 05/26/2021    History of Present Illness 70 yo male presents [facility] on 7/12 with SOB, coughing x1 week. Workup for new onset CHF. Chest CTA showed acute submassive bilateral PEs with RVE and right heart strain. Pt admitted with acute respiratory failure due to submassive PE. PMH includes carpal tunnel with R release 2018, HTN, obesity.   Clinical Impression   Pt presents with decline in function and safety with ADLs and ADL mobility with impaired strength, balance and endurance. PTA pt lived at home alone and was Ind with ADLs, IADLs, home mgt, cooking, used cane for mobility and was driving. Pt currently requires set up with UB ADLs, min A with LB ADLs, min guard A with toileting tasks and mobility. Pt would benefit from acute OT services to address impairments to maximize level of function and safety    Follow Up Recommendations  Home health OT    Equipment Recommendations  Tub/shower bench;Other (comment) (tub transfer bench, RW)    Recommendations for Other Services       Precautions / Restrictions Precautions Precautions: Fall Restrictions Weight Bearing Restrictions: No      Mobility Bed Mobility Overal bed mobility: Modified Independent             General bed mobility comments: pt in recliner upon arrival    Transfers Overall transfer level: Needs assistance Equipment used: Straight cane;1 person hand held assist Transfers: Sit to/from Stand Sit to Stand: Min guard         General transfer comment: Pt stands 1x from EOB and 1x from commode with min G for safety.    Balance Overall balance assessment: Needs assistance Sitting-balance support: Feet supported Sitting balance-Leahy Scale: Good Sitting balance - Comments: Pt able to don socks without reliance of UE support   Standing balance support: During functional activity;Single  extremity supported;No upper extremity supported Standing balance-Leahy Scale: Poor Standing balance comment: Pt reliant on at least single UE support and external support to maintain standing balance                           ADL either performed or assessed with clinical judgement   ADL Overall ADL's : Needs assistance/impaired Eating/Feeding: Independent;Sitting   Grooming: Wash/dry hands;Wash/dry face;Min guard;Standing   Upper Body Bathing: Set up;Supervision/ safety;Sitting   Lower Body Bathing: Minimal assistance Lower Body Bathing Details (indicate cue type and reason): poor standing balance Upper Body Dressing : Set up;Supervision/safety;Sitting   Lower Body Dressing: Minimal assistance Lower Body Dressing Details (indicate cue type and reason): poor standing balance Toilet Transfer: Min guard;Ambulation;Comfort height toilet;Grab bars;Cueing for safety   Toileting- Clothing Manipulation and Hygiene: Min guard;Sit to/from stand       Functional mobility during ADLs: Min guard;Cueing for safety       Vision Baseline Vision/History: Wears glasses Wears Glasses: At all times Patient Visual Report: No change from baseline       Perception     Praxis      Pertinent Vitals/Pain Pain Assessment: No/denies pain Faces Pain Scale: Hurts a little bit Pain Location: R flank Pain Descriptors / Indicators: Discomfort Pain Intervention(s): Monitored during session;Repositioned     Hand Dominance Right   Extremity/Trunk Assessment Upper Extremity Assessment Upper Extremity Assessment: Overall WFL for tasks assessed   Lower Extremity Assessment Lower Extremity Assessment: Defer to PT evaluation   Cervical /  Trunk Assessment Cervical / Trunk Assessment: Normal   Communication Communication Communication: No difficulties   Cognition Arousal/Alertness: Awake/alert Behavior During Therapy: WFL for tasks assessed/performed Overall Cognitive Status:  Within Functional Limits for tasks assessed                                     General Comments  Pt initally on 4 L [place] with o2 sats 96%. Pt desats on 4 L with mobility, requiring up to 6 L and poor waveform. Pt placed on 5 L [place] at end of session with o2 sats between 92-93%.    Exercises     Shoulder Instructions      Home Living Family/patient expects to be discharged to:: Private residence Living Arrangements: Alone Available Help at Discharge: Other (Comment) (none) Type of Home: House Home Access: Stairs to enter [employer] of Steps: 4 Entrance Stairs-Rails: Left Home Layout: One level     Bathroom Shower/Tub: Teacher, early years/pre: Standard     Home Equipment: Cane - quad;Crutches          Prior Functioning/Environment Level of Independence: Independent with assistive device(s)    ADL's / Homemaking Assistance Needed: Pt reports independence in ADLs, home mgt and cooking and use of cane for mobility and use of crutches in community. Pt reports diffculty stepping in and out of tub shower   Comments: Pt reports independence in ADLs and use of cane for mobility and use of crutches in community.        OT Problem List: Decreased strength      OT Treatment/Interventions: Self-care/ADL training;Therapeutic exercise;Patient/family education;Balance training;Therapeutic activities;DME and/or AE instruction    OT Goals(Current goals can be found in the care plan section) Acute Rehab OT Goals Patient Stated Goal: be more active OT Goal Formulation: With patient Time For Goal Achievement: 06/09/21 Potential to Achieve Goals: Good ADL Goals Pt Will Perform Grooming: with supervision;with set-up;standing Pt Will Perform Lower Body Bathing: with supervision;with set-up Pt Will Perform Lower Body Dressing: with supervision;with set-up Pt Will Transfer to Toilet: with supervision;with modified independence;ambulating Pt Will Perform  Toileting - Clothing Manipulation and hygiene: with supervision;with modified independence;sit to/from stand  OT Frequency: Min 2X/week   Barriers to D/C:            Co-evaluation              AM-PAC OT "6 Clicks" Daily Activity     Outcome Measure Help from another person eating meals?: None Help from another person taking care of personal grooming?: A Little Help from another person toileting, which includes using toliet, bedpan, or urinal?: A Little Help from another person bathing (including washing, rinsing, drying)?: A Little Help from another person to put on and taking off regular upper body clothing?: A Little Help from another person to put on and taking off regular lower body clothing?: A Little 6 Click Score: 19   End of Session Equipment Utilized During Treatment: Gait belt  Activity Tolerance: Patient tolerated treatment well Patient left: in chair;with call bell/phone within reach  OT Visit Diagnosis: Unsteadiness on feet (R26.81);Other abnormalities of gait and mobility (R26.89);Muscle weakness (generalized) (M62.81);Pain Pain - Right/Left: Right Pain - part of body:  (flank area)                Time: 3557-3220 OT Time Calculation (min): 21 min Charges:  OT General Charges $OT Visit:  1 Visit OT Evaluation $OT Eval Low Complexity: 1 Low    Britt Bottom 05/26/2021, 2:20 PM

## 2021-05-26 NOTE — Progress Notes (Signed)
ANTICOAGULATION CONSULT NOTE - Follow Up Consult  Pharmacy Consult for Heparin Indication: pulmonary embolus and DVT RLE  No Known Allergies  Patient Measurements: Height: 5\' 7"  (170.2 cm) Weight: 103.6 kg (228 lb 4.8 oz) IBW/kg (Calculated) : 66.1 Heparin Dosing Weight: 78 kg  Vital Signs: Temp: 98.7 F (37.1 C) (07/15 1059) Temp Source: Oral (07/15 1059) BP: 125/98 (07/15 1059)  Labs: Recent Labs    05/23/21 2051 05/23/21 2315 05/24/21 0337 05/24/21 1741 05/25/21 0334 05/25/21 1203 05/25/21 1954 05/26/21 0551 05/26/21 1133  HGB 16.6  --  17.2*  --  15.3  --   --  15.2  --   HCT 51.6  --  51.2  --  45.9  --   --  46.0  --   PLT 457*  --  481*  --  458*  --   --  468*  --   HEPARINUNFRC  --   --   --    < > 0.32   < > 0.24* 0.59 0.54  CREATININE 0.95  --  0.89  --  0.89  --   --  0.84  --   TROPONINIHS 9 10  --   --   --   --   --   --   --    < > = values in this interval not displayed.     Estimated Creatinine Clearance: 92.5 mL/min (by C-G formula based on SCr of 0.84 mg/dL).  Assessment:  70 yr old male admitted last night with shortness of breath and leg swelling. CTA 05/24/21 shows submasssive PE with right heart strain. Pharmacy consulted for IV heparin. Not on anticoagulants prior to admission. Duplex 7/14 shows RLE DVT of indeterminate age.   Heparin level remains therapeutic (0.54) on heparin drip at 1950 units/hr.  CBC stable. No bleeding reported.   Goal of Therapy:  Heparin level 0.3-0.7 units/ml Monitor platelets by anticoagulation protocol: Yes   Plan:   Continue heparin drip at 1950 units/hr  Daily heparin level and CBC.  Will follow up for transition to oral agent.  Arty Baumgartner, RPh 05/26/2021,12:59 PM

## 2021-05-26 NOTE — Progress Notes (Signed)
PROGRESS NOTE    ZYRON DEELEY   UTM:[id number]  DOB: [birth year]  PCP: Wenda Low, MD    DOA: 05/23/2021 LOS: 3   Assessment & Plan   Principal Problem:   Acute saddle pulmonary embolism ([place]) Active Problems:   Acute CHF (congestive heart failure) ([place])   Acute respiratory failure with hypoxia (H[facility])   SOB (shortness of breath)   Prolonged QT interval   Right ventricular enlargement   Cough   Pleurisy   Hypokalemia   Obesity (BMI 30-39.9)   Bilateral lower extremity edema   Acute hypoxic respiratory failure secondary to acute submassive pulmonary embolism Patient reports sedentary lifestyle.  No known malignancies. Hemodynamically stable, O2 needs stable.   Continue Heparin drip. PCCM consulted - see their recs. Transition to [place] prior to d/c. Supplement o2 for sats above 90%.  Cough due to PE.   Scheduled Mucinex, PRN Tussionex, PRN Tessalon  Right Lower Extremity DVT - seen on LE doppler U/S. Anticoagulate as above. Mobilize as tolerated.  Chronic diastolic CHF - mildly decompensated on admission due to acute PE with right heart strain. Presented with dyspnea x 1 week, b/l lower extremity edema, elevated BNP, pulmonary edema on CXR. Treated with IV Lasix in the ED, continued briefly on admision but stopped once PE diagnosed. Strict I/O's, daily weights. Cardiology consulted.   Hypokalemia Replaced.  Monitor, replace PRN for goal K>4   Obesity Body mass index is 36.01 kg/m. Complicates overall care and prognosis.  Recommend lifestyle modifications including physical activity and diet for weight loss and overall long-term health.    Bilateral lower extremity edema Self-reported sedentary lifestyle sitting for 7 to 8 hours/day   Prolonged QTC QTC from admission twelve-lead EKG 505 Avoid QTC prolonging agents Optimize magnesium and potassium levels.    Patient BMI: Body mass index is 36.01 kg/m.   DVT prophylaxis: SCDs Start: 05/24/21  0119   Diet:  Diet Orders (From admission, onward)     Start     Ordered   05/24/21 0226  Diet Heart Room service appropriate? Yes; Fluid consistency: Thin; Fluid restriction: 1800 mL Fluid  Diet effective now       Question Answer Comment  Room service appropriate? Yes   Fluid consistency: Thin   Fluid restriction: 1800 mL Fluid      05/24/21 0225              Code Status: Full Code   Brief Narrative / Hospital Course to Date:   70 y.o. male with medical history significant for essential hypertension, who presented to [facility] ED with complaints of gradually worsening shortness of breath in the last few days, associated with non productive cough and bilateral lower extremity edema which he noted about 2 months ago, improved after stopping alcohol use 3 weeks ago.  Admits to a sedentary lifestyle sitting for 7-8 hours per day due to chronic bilateral knee pain.    He was hypoxic on arrival to the ED and ultimately found to have an acute submassive PE on CTA chest.   Patient admitted to [facility].   Started on heparin drip. Pulmonology consulted.   Patient has been hemodynamically stable and clinically improving with anticoagulation.  No plan for thrombolytics at this time.   Subjective 05/27/21    Pt seen up in recliner after working with therapy.  Reports still SOB with exertion but improved.  Continues to have cough which is his only complaint.  Added Mucinex, pt reports it works well for him.  Disposition Plan & Communication   Status is: Inpatient  Remains inpatient appropriate because:IV treatments appropriate due to intensity of illness or inability to take PO  Dispo:  Patient From: Home  Planned Disposition: Home  Medically stable for discharge: No      Consults, Procedures, Significant Events   Consultants:  Pulmonology Cardiology  Procedures:  None  Antimicrobials:  Anti-infectives (From admission, onward)    None         Micro    Objective    Vitals:   05/26/21 0400 05/26/21 1059 05/26/21 1955 05/27/21 0340  BP: 121/86 (!) 125/98 125/83 120/87  Pulse:   67 71  Resp: (!) 21 (!) 21 18 18   Temp: 99 F (37.2 C) 98.7 F (37.1 C) 98.5 F (36.9 C) 98.9 F (37.2 C)  TempSrc: Oral Oral Oral Oral  SpO2: 96% 93% 99% 93%  Weight: 103.6 kg   104.3 kg  Height:        Intake/Output Summary (Last 24 hours) at 05/27/2021 0758 Last data filed at 05/27/2021 0400 Gross per 24 hour  Intake 1768.01 ml  Output 654 ml  Net 1114.01 ml   Filed Weights   05/25/21 0400 05/26/21 0400 05/27/21 0340  Weight: 102.7 kg 103.6 kg 104.3 kg    Physical Exam:  General exam: awake, appears fatigued, no acute distress, obese Respiratory system: CTAB, no wheezes, normal respiratory effort. Deep inspirations trigger cough Cardiovascular system: normal S1/S2, RRR Central nervous system: no gross focal neurologic deficits, normal speech Extremities moves all, b/l LE edema improved   Labs   Data Reviewed: I have personally reviewed following labs and imaging studies  CBC: Recent Labs  Lab 05/23/21 2051 05/24/21 0337 05/25/21 0334 05/26/21 0551 05/27/21 0324  WBC 10.5 11.4* 9.8 9.9 9.4  NEUTROABS 8.2*  --   --   --   --   HGB 16.6 17.2* 15.3 15.2 14.9  HCT 51.6 51.2 45.9 46.0 45.7  MCV 100.0 97.3 98.1 99.6 100.2*  PLT 457* 481* 458* 468* 742*   Basic Metabolic Panel: Recent Labs  Lab 05/23/21 2051 05/24/21 0337 05/25/21 0334 05/26/21 0551 05/27/21 0324  NA 136 138 140 136 136  K 3.3* 3.3* 3.3* 3.8 4.3  CL 92* 91* 96* 99 98  CO2 33* 36* 34* 30 30  GLUCOSE 111* 104* 98 99 91  BUN 10 9 10 10 10   CREATININE 0.95 0.89 0.89 0.84 0.81  CALCIUM 8.8* 8.8* 8.8* 8.9 8.7*  MG  --  2.1  --   --   --   PHOS  --  4.0  --   --   --    GFR: Estimated Creatinine Clearance: 96.3 mL/min (by C-G formula based on SCr of 0.81 mg/dL). Liver Function Tests: Recent Labs  Lab 05/23/21 2051  AST 25  ALT 24  ALKPHOS 80  BILITOT 1.4*  PROT 7.1   ALBUMIN 3.3*   No results for input(s): LIPASE, AMYLASE in the last 168 hours. No results for input(s): AMMONIA in the last 168 hours. Coagulation Profile: No results for input(s): INR, PROTIME in the last 168 hours. Cardiac Enzymes: No results for input(s): CKTOTAL, CKMB, CKMBINDEX, TROPONINI in the last 168 hours. BNP (last 3 results) No results for input(s): PROBNP in the last 8760 hours. HbA1C: No results for input(s): HGBA1C in the last 72 hours. CBG: Recent Labs  Lab 05/25/21 2103  GLUCAP 136*   Lipid Profile: Recent Labs    05/26/21 0551  CHOL 142  HDL  42  LDLCALC 83  TRIG 87  CHOLHDL 3.4   Thyroid Function Tests: No results for input(s): TSH, T4TOTAL, FREET4, T3FREE, THYROIDAB in the last 72 hours. Anemia Panel: No results for input(s): VITAMINB12, FOLATE, FERRITIN, TIBC, IRON, RETICCTPCT in the last 72 hours. Sepsis Labs: Recent Labs  Lab 05/23/21 2057 05/24/21 1510 05/24/21 1741 05/25/21 1203  LATICACIDVEN 1.8 2.2* 2.2* 1.4    Recent Results (from the past 240 hour(s))  Blood culture (routine x 2)     Status: None (Preliminary result)   Collection Time: 05/23/21  9:00 PM   Specimen: BLOOD  Result Value Ref Range Status   Specimen Description BLOOD LEFT ANTECUBITAL  Final   Special Requests   Final    BOTTLES DRAWN AEROBIC AND ANAEROBIC Blood Culture results may not be optimal due to an inadequate volume of blood received in culture bottles   Culture   Final    NO GROWTH 3 DAYS Performed at [facility], [place] [address], [place], [place] [address]    Report Status PENDING  Incomplete  Blood culture (routine x 2)     Status: None (Preliminary result)   Collection Time: 05/23/21  9:07 PM   Specimen: BLOOD LEFT HAND  Result Value Ref Range Status   Specimen Description BLOOD LEFT HAND  Final   Special Requests   Final    BOTTLES DRAWN AEROBIC AND ANAEROBIC Blood Culture results may not be optimal due to an inadequate volume of blood received  in culture bottles   Culture   Final    NO GROWTH 3 DAYS Performed at [facility], [place] [address], [place], [place] [address]    Report Status PENDING  Incomplete  Resp Panel by RT-PCR (Flu A&B, Covid) Nasopharyngeal Swab     Status: None   Collection Time: 05/24/21 12:33 AM   Specimen: Nasopharyngeal Swab; Nasopharyngeal(NP) swabs in vial transport medium  Result Value Ref Range Status   SARS Coronavirus 2 by RT PCR NEGATIVE NEGATIVE Final    Comment: (NOTE) SARS-CoV-2 target nucleic acids are NOT DETECTED.  The SARS-CoV-2 RNA is generally detectable in upper respiratory specimens during the acute phase of infection. The lowest concentration of SARS-CoV-2 viral copies this assay can detect is 138 copies/mL. A negative result does not preclude SARS-Cov-2 infection and should not be used as the sole basis for treatment or other patient management decisions. A negative result may occur with  improper specimen collection/handling, submission of specimen other than nasopharyngeal swab, presence of viral mutation(s) within the areas targeted by this assay, and inadequate number of viral copies(<138 copies/mL). A negative result must be combined with clinical observations, patient history, and epidemiological information. The expected result is Negative.  Fact Sheet for Patients:  EntrepreneurPulse.com.au  Fact Sheet for Healthcare Providers:  IncredibleEmployment.be  This test is no t yet approved or cleared by the [place] FDA and  has been authorized for detection and/or diagnosis of SARS-CoV-2 by FDA under an Emergency Use Authorization (EUA). This EUA will remain  in effect (meaning this test can be used) for the duration of the COVID-19 declaration under Section 564(b)(1) of the Act, 21 U.S.C.section 360bbb-3(b)(1), unless the authorization is terminated  or revoked sooner.       Influenza A by PCR NEGATIVE NEGATIVE Final    Influenza B by PCR NEGATIVE NEGATIVE Final    Comment: (NOTE) The Xpert Xpress SARS-CoV-2/FLU/RSV plus assay is intended as an aid in the diagnosis of influenza from Nasopharyngeal  swab specimens and should not be used as a sole basis for treatment. Nasal washings and aspirates are unacceptable for Xpert Xpress SARS-CoV-2/FLU/RSV testing.  Fact Sheet for Patients: EntrepreneurPulse.com.au  Fact Sheet for Healthcare Providers: IncredibleEmployment.be  This test is not yet approved or cleared by the [place] FDA and has been authorized for detection and/or diagnosis of SARS-CoV-2 by FDA under an Emergency Use Authorization (EUA). This EUA will remain in effect (meaning this test can be used) for the duration of the COVID-19 declaration under Section 564(b)(1) of the Act, 21 U.S.C. section 360bbb-3(b)(1), unless the authorization is terminated or revoked.  Performed at [facility], [place] [address] [place], [place] [address]       Imaging Studies   VAS [place] LOWER EXTREMITY VENOUS (DVT)  Result Date: 05/25/2021  Lower Venous DVT Study Patient Name:  Joshu Rosalie Doctor  Date of Exam:   05/25/2021 Medical Rec #: [id number]         Accession #:    [id number] Date of Birth: 20-Mar-1951         Patient Gender: M Patient Age:   69Y Exam Location:  [facility] Procedure:      VAS [place] LOWER EXTREMITY VENOUS (DVT) Referring Phys: [id number] [place] --------------------------------------------------------------------------------  Indications: Edema.  Comparison Study: no prior Performing Technologist: Archie Patten RVS  Examination Guidelines: A complete evaluation includes B-mode imaging, spectral Doppler, color Doppler, and power Doppler as needed of all accessible portions of each vessel. Bilateral testing is considered an integral part of a complete examination. Limited examinations for reoccurring indications may be performed as noted. The  reflux portion of the exam is performed with the patient in reverse Trendelenburg.  +---------+---------------+---------+-----------+----------+-----------------+ RIGHT    CompressibilityPhasicitySpontaneityPropertiesThrombus Aging    +---------+---------------+---------+-----------+----------+-----------------+ CFV      Partial        Yes      Yes                  Age Indeterminate +---------+---------------+---------+-----------+----------+-----------------+ SFJ      Full                                                           +---------+---------------+---------+-----------+----------+-----------------+ FV Prox  None                                         Age Indeterminate +---------+---------------+---------+-----------+----------+-----------------+ FV Mid   None                                         Age Indeterminate +---------+---------------+---------+-----------+----------+-----------------+ FV DistalNone                                         Age Indeterminate +---------+---------------+---------+-----------+----------+-----------------+ PFV      Full                                                           +---------+---------------+---------+-----------+----------+-----------------+  POP      None           No       No                   Age Indeterminate +---------+---------------+---------+-----------+----------+-----------------+ PTV      Partial                                      Age Indeterminate +---------+---------------+---------+-----------+----------+-----------------+ PERO     None                                         Age Indeterminate +---------+---------------+---------+-----------+----------+-----------------+   +---------+---------------+---------+-----------+----------+--------------+ LEFT     CompressibilityPhasicitySpontaneityPropertiesThrombus Aging  +---------+---------------+---------+-----------+----------+--------------+ CFV      Full           Yes      Yes                                 +---------+---------------+---------+-----------+----------+--------------+ SFJ      Full                                                        +---------+---------------+---------+-----------+----------+--------------+ FV Prox  Full                                                        +---------+---------------+---------+-----------+----------+--------------+ FV Mid   Full                                                        +---------+---------------+---------+-----------+----------+--------------+ FV DistalFull                                                        +---------+---------------+---------+-----------+----------+--------------+ PFV      Full                                                        +---------+---------------+---------+-----------+----------+--------------+ POP      Full           Yes      Yes                                 +---------+---------------+---------+-----------+----------+--------------+ PTV      Full                                                        +---------+---------------+---------+-----------+----------+--------------+  PERO     Full                                                        +---------+---------------+---------+-----------+----------+--------------+     Summary: RIGHT: - Findings consistent with age indeterminate deep vein thrombosis involving the right common femoral vein, right femoral vein, right popliteal vein, right posterior tibial veins, and right peroneal veins. - A cystic structure is found in the popliteal fossa.  LEFT: - There is no evidence of deep vein thrombosis in the lower extremity.  - No cystic structure found in the popliteal fossa.  *See table(s) above for measurements and observations. Electronically signed by Jamelle Haring  on 05/25/2021 at 5:10:39 PM.    Final      Medications   Scheduled Meds:  dextromethorphan-guaiFENesin  1 tablet Oral BID   multivitamin with minerals  1 tablet Oral Daily   omega-3 acid ethyl esters  1 g Oral Daily   polyethylene glycol  17 g Oral Daily   Continuous Infusions:  heparin 1,950 Units/hr (05/27/21 0619)       LOS: 3 days    Time spent: 25 minutes with > 50% spent at bedside and in coordination of care.    Ezekiel Slocumb, DO [facility]  05/27/2021, 7:58 AM      If 7PM-7AM, please contact night-coverage. How to contact the [facility] Attending or Consulting provider [place] or covering provider during after hours [place], for this patient?    Check the care team in [facility] and look for a) attending/consulting [facility] provider listed and b) the [facility] team listed Log into [email or web address] and use [place]'s universal password to access. If you do not have the password, please contact the hospital operator. Locate the [facility] provider you are looking for under [facility] and page to a number that you can be directly reached. If you still have difficulty reaching the provider, please page the [facility] (Director on Call) for the Hospitalists listed on amion for assistance.

## 2021-05-26 NOTE — Progress Notes (Addendum)
   NAME:  Alexander Robles, MRN:  [id number], DOB:  07/12/1951, LOS: 2 ADMISSION DATE:  05/23/2021, CONSULTATION DATE:  05/24/21 REFERRING MD:  Arbutus Ped, CHIEF COMPLAINT:  sob   History of Present Illness:  70 year old man with hx of HTN admitted 7/12 with worsening DOE for past week.  Accompanied by cough and pleurisy 2 days  PTA. Additionally, he noted 1-2 months worsening lower ext edema that he attributes to poor mobility.  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  Workup in ER revealed possible CHF flare.  Subsequent workup showed elevated D dimer and CTA showing submassive PE.  PCCM consulted for consideration of advanced PE therapies.  No syncope, had one episode of feeling lightheaded when he over-exerted himself a couple days ago, thinks from breathlessness.  Pertinent  Medical History  HTN Osteoarthritis No history of strokes, bleeding issues, surgeries  Significant Hospital Events: Including procedures, antibiotic start and stop dates in addition to other pertinent events   7/12 admitted 7/13 PCCM consulted for evaluation of PE. Troponin 10, BNP 568. LA 2.2 7/13 CTA chest > acute PE with CT evidence of R heart strain c/w submassive PE 7/13 ECHO > LVEF 60-65%, no RWMA, moderate LVH, grade I diastolic dysfunction, RV systolic function moderately reduced. RV mildly enlarged, D-shaped interventricular septum suggestive of RV pressure overload, RA pressure 8  Interim History / Subjective:  Biggest concern is cough which he is requesting narcotics and I suspect he has underlying OSA.[name]  Denies shortness of breath, chest pain Objective   Blood pressure 121/86, pulse 80, temperature 99 F (37.2 C), temperature source Oral, resp. rate (!) 21, height 5\' 7"  (1.702 m), weight 103.6 kg, SpO2 96 %.        Intake/Output Summary (Last 24 hours) at 05/26/2021 1026 Last data filed at 05/26/2021 0813 Gross per 24 hour  Intake 785.05 ml  Output  929 ml  Net -143.95 ml   Filed Weights   05/24/21 0318 05/25/21 0400 05/26/21 0400  Weight: 101.8 kg 102.7 kg 103.6 kg   Physical Exam: General: Well-appearing, no acute distress HENT: [place], AT, OP clear, MMM Eyes: EOMI, no scleral icterus Respiratory: Diminished but clear to auscultation bilaterally.  No crackles, wheezing or rales Cardiovascular: RRR, -M/R/G, no JVD GI: BS+, soft, nontender Extremities:-Edema,-tenderness Neuro: AAO x4, CNII-XII grossly intact Skin: Intact, no rashes or bruising Psych: Normal mood, normal affect    Resolved Hospital Problem list      Assessment & Plan:   Acute submassive pulmonary embolism Right lower extremity DVT PESI 121 due to tachypnea, O2 need, age, male sex.  Class IV at higher risk of mortality.  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  He has no concerning clinical history and vitals are benign.  Troponin negative, lactate negative.  BNP elevated.  No contraindications for thrombolytics.  Hemodynamically stable  --Recommend transition from heparin to DOAC prior to DC --No indication for lytic therapy  Cough, PleurisyFrom above and early infarct in RUL on CT --Cough suppressant  Pulmonary critical care will sign off at this time Best Practice (right click and "Reselect all SmartList Selections" daily)  Per primary     [place] Acute Care Nurse Practitioner [place] Please consult [place] 05/26/2021, 10:26 AM

## 2021-05-26 NOTE — Evaluation (Signed)
Physical Therapy Evaluation Patient Details Name: Alexander Robles MRN: [id number] DOB: [birth year] Today's Date: 05/26/2021   History of Present Illness  70 yo male presents to [facility] on 7/12 with SOB, coughing x1 week. Workup for new onset CHF. Chest CTA showed acute submassive bilateral PEs with RVE and right heart strain. Pt admitted with acute respiratory failure due to submassive PE. PMH includes carpal tunnel with R release 2018, HTN, obesity.  Clinical Impression  Pt demonstrates generalized weakness and deficits in balance, gait, and activity tolerance. Pt ambulates short distances with use of cane and demonstrates significant instability. Additional bout of gait training with use of RW for household distances performed with pt demonstrating and reporting improved stability. Pt with increased work of breathing, limiting distance ambulated. Pt will benefit from continued acute PT to improve dynamic balance activities and activity tolerance. SPT recommends HHPT to assist with improving balance and return to prior level.     Follow Up Recommendations Home health PT    Equipment Recommendations  Rolling walker with 5" wheels    Recommendations for Other Services       Precautions / Restrictions Precautions Precautions: Fall Restrictions Weight Bearing Restrictions: No      Mobility  Bed Mobility Overal bed mobility: Modified Independent             General bed mobility comments: HOB elevated    Transfers Overall transfer level: Needs assistance Equipment used: None Transfers: Sit to/from Stand Sit to Stand: Min guard         General transfer comment: Pt stands 1x from EOB and 1x from commode with min G for safety.  Ambulation/Gait Ambulation/Gait assistance: Min guard Gait Distance (Feet): 320 Feet (initial bout of 20 ft to commode with cane and additional 300 ft with RW.) Assistive device: Rolling walker (2 wheeled);Quad cane Gait Pattern/deviations:  Step-through pattern;Trunk flexed Gait velocity: reduced Gait velocity interpretation: >2.62 ft/sec, indicative of community ambulatory General Gait Details: pt ambulates with use of cane demonstrating significant instability. pt with improved stability with additional trial of gait with use of RW.  Stairs            Wheelchair Mobility    Modified Rankin (Stroke Patients Only)       Balance Overall balance assessment: Needs assistance Sitting-balance support: Feet supported Sitting balance-Leahy Scale: Good Sitting balance - Comments: Pt able to don socks without reliance of UE support   Standing balance support: During functional activity Standing balance-Leahy Scale: Poor Standing balance comment: Pt reliant on at least single UE support and external support to maintain standing balance                             Pertinent Vitals/Pain Pain Assessment: Faces Faces Pain Scale: Hurts a little bit Pain Location: R flank Pain Descriptors / Indicators: Discomfort Pain Intervention(s): Monitored during session    Home Living Family/patient expects to be discharged to:: Private residence Living Arrangements: Alone Available Help at Discharge:  (none) Type of Home: House Home Access: Stairs to enter Entrance Stairs-Rails: Left Entrance Stairs-Number of Steps: 4 Home Layout: One level Home Equipment: Cane - quad;Crutches      Prior Function Level of Independence: Independent with assistive device(s)         Comments: Pt reports independence in ADLs and use of cane for mobility and use of crutches in community.     Hand Dominance  Extremity/Trunk Assessment   Upper Extremity Assessment Upper Extremity Assessment: Defer to OT evaluation    Lower Extremity Assessment Lower Extremity Assessment: Generalized weakness    Cervical / Trunk Assessment Cervical / Trunk Assessment: Normal  Communication   Communication: No difficulties   Cognition Arousal/Alertness: Awake/alert Behavior During Therapy: WFL for tasks assessed/performed Overall Cognitive Status: Within Functional Limits for tasks assessed                                        General Comments General comments (skin integrity, edema, etc.): Pt initally on 4 L [place] with o2 sats 96%. Pt desats on 4 L with mobility, requiring up to 6 L and poor waveform. Pt placed on 5 L  at end of session with o2 sats between 92-93%.    Exercises     Assessment/Plan    PT Assessment Patient needs continued PT services  PT Problem List Decreased strength;Decreased activity tolerance;Decreased balance;Decreased mobility;Decreased knowledge of use of DME;Decreased knowledge of precautions;Cardiopulmonary status limiting activity       PT Treatment Interventions DME instruction;Gait training;Stair training;Functional mobility training;Therapeutic activities;Therapeutic exercise;Balance training;Patient/family education    PT Goals (Current goals can be found in the Care Plan section)  Acute Rehab PT Goals Patient Stated Goal: be more active PT Goal Formulation: With patient Time For Goal Achievement: 06/09/21 Potential to Achieve Goals: Good    Frequency Min 3X/week   Barriers to discharge        Co-evaluation               AM-PAC PT "6 Clicks" Mobility  Outcome Measure Help needed turning from your back to your side while in a flat bed without using bedrails?: None Help needed moving from lying on your back to sitting on the side of a flat bed without using bedrails?: None Help needed moving to and from a bed to a chair (including a wheelchair)?: A Little Help needed standing up from a chair using your arms (e.g., wheelchair or bedside chair)?: A Little Help needed to walk in hospital room?: A Little Help needed climbing 3-5 steps with a railing? : A Lot 6 Click Score: 19    End of Session Equipment Utilized During Treatment: Gait  belt;Oxygen Activity Tolerance: Patient limited by fatigue Patient left: in chair;with call bell/phone within reach;with chair alarm set;with nursing/sitter in room Nurse Communication: Mobility status;Other (comment) (o2 sats) PT Visit Diagnosis: Unsteadiness on feet (R26.81);Other abnormalities of gait and mobility (R26.89);Muscle weakness (generalized) (M62.81)    Time: 4259-5638 PT Time Calculation (min) (ACUTE ONLY): 43 min   Charges:   PT Evaluation $PT Eval Low Complexity: 1 Low PT Treatments $Gait Training: 8-22 mins        Acute Rehab  Pager: [phone]   Garwin Brothers, SPT  05/26/2021, 12:33 PM

## 2021-05-26 NOTE — Progress Notes (Signed)
[facility] for IV Heparin Indication: pulmonary embolus  No Known Allergies  Patient Measurements: Height: 5\' 7"  (170.2 cm) Weight: 103.6 kg (228 lb 4.8 oz) IBW/kg (Calculated) : 66.1 Heparin Dosing Weight:  88.4 kg  Vital Signs: Temp: 99 F (37.2 C) (07/15 0400) Temp Source: Oral (07/15 0400) BP: 121/86 (07/15 0400)  Labs: Recent Labs    05/23/21 2051 05/23/21 2315 05/24/21 0337 05/24/21 1741 05/25/21 0334 05/25/21 1203 05/25/21 1954 05/26/21 0551  HGB 16.6  --  17.2*  --  15.3  --   --  15.2  HCT 51.6  --  51.2  --  45.9  --   --  46.0  PLT 457*  --  481*  --  458*  --   --  468*  HEPARINUNFRC  --   --   --    < > 0.32 0.33 0.24* 0.59  CREATININE 0.95  --  0.89  --  0.89  --   --   --   TROPONINIHS 9 10  --   --   --   --   --   --    < > = values in this interval not displayed.    Estimated Creatinine Clearance: 87.3 mL/min (by C-G formula based on SCr of 0.89 mg/dL).   Medical History: Past Medical History:  Diagnosis Date   Carpal tunnel syndrome    Cubital tunnel syndrome on right    Hypertension    states under control with meds., has been on med. x 5 yr.   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 CTA today showed submasssive PE with right heart strain. Pharmacy was consulted to dose IV heparin. Pt had Lovenox 40 mg SQ Q 24 hr previously ordered for VTE prophylaxis, but dose had not yet been given. Pt was not on anticoagulant prior to admission.  Initial heparin level ~7 hrs after heparin 5000 units IV bolus X 1, followed by heparin infusion at 1350 units/hr was 0.11 units/ml, which is below the goal range for this pt. H/H 17.2/51.2, plt 481. Per RN, no issues with IV or bleeding observed.  7/15 AM update:  Heparin level therapeutic after rate increase Hgb 15.2  Goal of Therapy:  Heparin level  0.3-0.7 units/ml Monitor platelets by anticoagulation protocol: Yes   Plan:  Cont heparin 1950 units/hr 1200 heparin level  Narda Bonds, PharmD, BCPS Clinical Pharmacist Phone: [phone]

## 2021-05-26 NOTE — Plan of Care (Signed)
  Problem: Health Behavior/Discharge Planning: Goal: Ability to manage health-related needs will improve Outcome: Progressing   Problem: Clinical Measurements: Goal: Ability to maintain clinical measurements within normal limits will improve Outcome: Progressing Goal: Will remain free from infection Outcome: Progressing Goal: Diagnostic test results will improve Outcome: Progressing   

## 2021-05-26 NOTE — Progress Notes (Signed)
Cardiology Progress Note  Patient ID: Alexander Robles MRN: [id number] DOB: 07-31-1951 Date of Encounter: 05/26/2021  Primary Cardiologist: None  Subjective   Chief Complaint: None.  HPI: Doing well this morning.  No heart failure symptoms.  Still on heparin.  ROS:  All other ROS reviewed and negative. Pertinent positives noted in the HPI.     Inpatient Medications  Scheduled Meds:  multivitamin with minerals  1 tablet Oral Daily   omega-3 acid ethyl esters  1 g Oral Daily   polyethylene glycol  17 g Oral Daily   Continuous Infusions:  heparin 1,950 Units/hr (05/26/21 0435)   PRN Meds: acetaminophen, benzonatate, chlorpheniramine-HYDROcodone, menthol-cetylpyridinium, oxyCODONE, tetrahydrozoline, traMADol   Vital Signs   Vitals:   05/25/21 1530 05/25/21 1900 05/26/21 0000 05/26/21 0400  BP: 132/81 109/77 112/69 121/86  Pulse: 80     Resp: 20 20 (!) 21 (!) 21  Temp: 98 F (36.7 C) 98.3 F (36.8 C) 99 F (37.2 C) 99 F (37.2 C)  TempSrc: Oral Oral Oral Oral  SpO2: 93% 93% 91% 96%  Weight:    103.6 kg  Height:        Intake/Output Summary (Last 24 hours) at 05/26/2021 1013 Last data filed at 05/26/2021 0813 Gross per 24 hour  Intake 785.05 ml  Output 929 ml  Net -143.95 ml   Last 3 Weights 05/26/2021 05/25/2021 05/24/2021  Weight (lbs) 228 lb 4.8 oz 226 lb 6.4 oz 224 lb 6.4 oz  Weight (kg) 103.556 kg 102.694 kg 101.787 kg      Telemetry  Overnight telemetry shows sinus rhythm in the 80s, which I personally reviewed.   Physical Exam   Vitals:   05/25/21 1530 05/25/21 1900 05/26/21 0000 05/26/21 0400  BP: 132/81 109/77 112/69 121/86  Pulse: 80     Resp: 20 20 (!) 21 (!) 21  Temp: 98 F (36.7 C) 98.3 F (36.8 C) 99 F (37.2 C) 99 F (37.2 C)  TempSrc: Oral Oral Oral Oral  SpO2: 93% 93% 91% 96%  Weight:    103.6 kg  Height:        Intake/Output Summary (Last 24 hours) at 05/26/2021 1013 Last data filed at 05/26/2021 0813 Gross per 24 hour  Intake  785.05 ml  Output 929 ml  Net -143.95 ml    Last 3 Weights 05/26/2021 05/25/2021 05/24/2021  Weight (lbs) 228 lb 4.8 oz 226 lb 6.4 oz 224 lb 6.4 oz  Weight (kg) 103.556 kg 102.694 kg 101.787 kg    Body mass index is 35.76 kg/m.  General: Well nourished, well developed, in no acute distress Head: Atraumatic, normal size  Eyes: PEERLA, EOMI  Neck: Supple, no JVD Endocrine: No thryomegaly Cardiac: Normal S1, S2; RRR; no murmurs, rubs, or gallops Lungs: Clear to auscultation bilaterally, no wheezing, rhonchi or rales  Abd: Soft, nontender, no hepatomegaly  Ext: No edema, pulses 2+ Musculoskeletal: No deformities, BUE and BLE strength normal and equal Skin: Warm and dry, no rashes   Neuro: Alert and oriented to person, place, time, and situation, CNII-XII grossly intact, no focal deficits  Psych: Normal mood and affect   Labs  High Sensitivity Troponin:   Recent Labs  Lab 05/23/21 2051 05/23/21 2315  TROPONINIHS 9 10     Cardiac EnzymesNo results for input(s): TROPONINI in the last 168 hours. No results for input(s): TROPIPOC in the last 168 hours.  Chemistry Recent Labs  Lab 05/23/21 2051 05/24/21 0337 05/25/21 0334 05/26/21 0551  NA 136 138 140  136  K 3.3* 3.3* 3.3* 3.8  CL 92* 91* 96* 99  CO2 33* 36* 34* 30  GLUCOSE 111* 104* 98 99  BUN 10 9 10 10   CREATININE 0.95 0.89 0.89 0.84  CALCIUM 8.8* 8.8* 8.8* 8.9  PROT 7.1  --   --   --   ALBUMIN 3.3*  --   --   --   AST 25  --   --   --   ALT 24  --   --   --   ALKPHOS 80  --   --   --   BILITOT 1.4*  --   --   --   GFRNONAA >60 >60 >60 >60  ANIONGAP 11 11 10 7     Hematology Recent Labs  Lab 05/24/21 0337 05/25/21 0334 05/26/21 0551  WBC 11.4* 9.8 9.9  RBC 5.26 4.68 4.62  HGB 17.2* 15.3 15.2  HCT 51.2 45.9 46.0  MCV 97.3 98.1 99.6  MCH 32.7 32.7 32.9  MCHC 33.6 33.3 33.0  RDW 14.4 14.4 14.3  PLT 481* 458* 468*   BNP Recent Labs  Lab 05/23/21 2051 05/24/21 0337  BNP 614.0* 568.7*    DDimer  Recent  Labs  Lab 05/24/21 0358  DDIMER 3.58*     Radiology  ECHOCARDIOGRAM COMPLETE  Result Date: 05/25/2021    ECHOCARDIOGRAM REPORT   Patient Name:   Alexander Robles Date of Exam: 05/24/2021 Medical Rec #:  [id number]        Height:       67.0 in Accession #:    [id number]       Weight:       224.4 lb Date of Birth:  12/03/1950        BSA:          2.124 m Patient Age:    9 years         BP:           107/73 mmHg Patient Gender: M                HR:           75 bpm. Exam Location:  Inpatient Procedure: 2D Echo, Cardiac Doppler and Color Doppler                    STAT ECHO Reported to: Dr Aundra Dubin on 05/24/2021 12:01:00 PM. Indications:     I26.02 Pulmonary embolus  History:         Patient has no prior history of Echocardiogram examinations.                  Risk Factors:Hypertension.  Sonographer:     Jonelle Sidle Dance Referring Phys:  [id number] Kayleen Memos Diagnosing Phys: Loralie Champagne MD IMPRESSIONS  1. Left ventricular ejection fraction, by estimation, is 60 to 65%. The left ventricle has normal function. The left ventricle has no regional wall motion abnormalities. There is moderate left ventricular hypertrophy. Left ventricular diastolic parameters are consistent with Grade I diastolic dysfunction (impaired relaxation).  2. Right ventricular systolic function is moderately reduced. The right ventricular size is mildly enlarged. Tricuspid regurgitation signal is inadequate for assessing PA pressure. D-shaped interventricular septum suggestive of RV pressure/volume overload.  3. The mitral valve is normal in structure. No evidence of mitral valve regurgitation. No evidence of mitral stenosis.  4. The aortic valve is tricuspid. Aortic valve regurgitation is trivial. Mild aortic valve sclerosis is present, with no evidence of aortic  valve stenosis.  5. Aortic dilatation noted. There is mild dilatation of the ascending aorta, measuring 41 mm.  6. The inferior vena cava is dilated in size with >50% respiratory  variability, suggesting right atrial pressure of 8 mmHg.  7. Technically difficult study, but there is evidence for RV strain on echo as above. FINDINGS  Left Ventricle: Left ventricular ejection fraction, by estimation, is 60 to 65%. The left ventricle has normal function. The left ventricle has no regional wall motion abnormalities. Definity contrast agent was given IV to delineate the left ventricular  endocardial borders. The left ventricular internal cavity size was normal in size. There is moderate left ventricular hypertrophy. Left ventricular diastolic parameters are consistent with Grade I diastolic dysfunction (impaired relaxation). Right Ventricle: The right ventricular size is mildly enlarged. No increase in right ventricular wall thickness. Right ventricular systolic function is moderately reduced. Tricuspid regurgitation signal is inadequate for assessing PA pressure. Left Atrium: Left atrial size was normal in size. Right Atrium: Right atrial size was normal in size. Pericardium: Trivial pericardial effusion is present. Mitral Valve: The mitral valve is normal in structure. Mild mitral annular calcification. No evidence of mitral valve regurgitation. No evidence of mitral valve stenosis. Tricuspid Valve: The tricuspid valve is not well visualized. Tricuspid valve regurgitation is not demonstrated. Aortic Valve: The aortic valve is tricuspid. Aortic valve regurgitation is trivial. Mild aortic valve sclerosis is present, with no evidence of aortic valve stenosis. Pulmonic Valve: The pulmonic valve was normal in structure. Pulmonic valve regurgitation is trivial. Aorta: Aortic dilatation noted. There is mild dilatation of the ascending aorta, measuring 41 mm. Venous: The inferior vena cava is dilated in size with greater than 50% respiratory variability, suggesting right atrial pressure of 8 mmHg. IAS/Shunts: No atrial level shunt detected by color flow Doppler.  LEFT VENTRICLE PLAX 2D LVIDd:          3.80 cm  Diastology LVIDs:         2.65 cm  LV e' medial:    6.34 cm/s LV PW:         1.30 cm  LV E/e' medial:  5.9 LV IVS:        1.50 cm  LV e' lateral:   8.39 cm/s LVOT diam:     2.30 cm  LV E/e' lateral: 4.4 LV SV:         51 LV SV Index:   24 LVOT Area:     4.15 cm  RIGHT VENTRICLE             IVC RV Basal diam:  3.00 cm     IVC diam: 1.10 cm RV S prime:     15.50 cm/s TAPSE (M-mode): 2.3 cm LEFT ATRIUM             Index       RIGHT ATRIUM           Index LA diam:        3.70 cm 1.74 cm/m  RA Area:     17.00 cm LA Vol (A2C):   58.7 ml 27.64 ml/m RA Volume:   37.90 ml  17.85 ml/m LA Vol (A4C):   50.1 ml 23.59 ml/m LA Biplane Vol: 56.0 ml 26.37 ml/m  AORTIC VALVE LVOT Vmax:   82.80 cm/s LVOT Vmean:  52.500 cm/s LVOT VTI:    0.123 m  AORTA Ao Root diam: 3.80 cm Ao Asc diam:  4.10 cm MITRAL VALVE MV Area (PHT): 2.62 cm  SHUNTS MV Decel Time: 289 msec    Systemic VTI:  0.12 m MV E velocity: 37.30 cm/s  Systemic Diam: 2.30 cm MV A velocity: 70.80 cm/s MV E/A ratio:  0.53 Loralie Champagne MD Electronically signed by Loralie Champagne MD Signature Date/Time: 05/24/2021/12:46:10 PM    Final (Updated)    VAS [place] LOWER EXTREMITY VENOUS (DVT)  Result Date: 05/25/2021  Lower Venous DVT Study Patient Name:  Alexander Robles  Date of Exam:   05/25/2021 Medical Rec #: [id number]         Accession #:    [id number] Date of Birth: 26-Sep-1951         Patient Gender: M Patient Age:   69Y Exam Location:  [facility] [place] Procedure:      VAS [place] LOWER EXTREMITY VENOUS (DVT) Referring Phys: [id number] [place] --------------------------------------------------------------------------------  Indications: Edema.  Comparison Study: no prior Performing Technologist: Archie Patten RVS  Examination Guidelines: A complete evaluation includes B-mode imaging, spectral Doppler, color Doppler, and power Doppler as needed of all accessible portions of each vessel. Bilateral testing is considered an integral part of a complete  examination. Limited examinations for reoccurring indications may be performed as noted. The reflux portion of the exam is performed with the patient in reverse Trendelenburg.  +---------+---------------+---------+-----------+----------+-----------------+ RIGHT    CompressibilityPhasicitySpontaneityPropertiesThrombus Aging    +---------+---------------+---------+-----------+----------+-----------------+ CFV      Partial        Yes      Yes                  Age Indeterminate +---------+---------------+---------+-----------+----------+-----------------+ SFJ      Full                                                           +---------+---------------+---------+-----------+----------+-----------------+ FV Prox  None                                         Age Indeterminate +---------+---------------+---------+-----------+----------+-----------------+ FV Mid   None                                         Age Indeterminate +---------+---------------+---------+-----------+----------+-----------------+ FV DistalNone                                         Age Indeterminate +---------+---------------+---------+-----------+----------+-----------------+ PFV      Full                                                           +---------+---------------+---------+-----------+----------+-----------------+ POP      None           No       No                   Age Indeterminate +---------+---------------+---------+-----------+----------+-----------------+ PTV      Partial  Age Indeterminate +---------+---------------+---------+-----------+----------+-----------------+ PERO     None                                         Age Indeterminate +---------+---------------+---------+-----------+----------+-----------------+   +---------+---------------+---------+-----------+----------+--------------+ LEFT      CompressibilityPhasicitySpontaneityPropertiesThrombus Aging +---------+---------------+---------+-----------+----------+--------------+ CFV      Full           Yes      Yes                                 +---------+---------------+---------+-----------+----------+--------------+ SFJ      Full                                                        +---------+---------------+---------+-----------+----------+--------------+ FV Prox  Full                                                        +---------+---------------+---------+-----------+----------+--------------+ FV Mid   Full                                                        +---------+---------------+---------+-----------+----------+--------------+ FV DistalFull                                                        +---------+---------------+---------+-----------+----------+--------------+ PFV      Full                                                        +---------+---------------+---------+-----------+----------+--------------+ POP      Full           Yes      Yes                                 +---------+---------------+---------+-----------+----------+--------------+ PTV      Full                                                        +---------+---------------+---------+-----------+----------+--------------+ PERO     Full                                                        +---------+---------------+---------+-----------+----------+--------------+  Summary: RIGHT: - Findings consistent with age indeterminate deep vein thrombosis involving the right common femoral vein, right femoral vein, right popliteal vein, right posterior tibial veins, and right peroneal veins. - A cystic structure is found in the popliteal fossa.  LEFT: - There is no evidence of deep vein thrombosis in the lower extremity.  - No cystic structure found in the popliteal fossa.  *See table(s) above for  measurements and observations. Electronically signed by Jamelle Haring on 05/25/2021 at 5:10:39 PM.    Final     Cardiac Studies  TTE 05/24/2021  1. Left ventricular ejection fraction, by estimation, is 60 to 65%. The  left ventricle has normal function. The left ventricle has no regional  wall motion abnormalities. There is moderate left ventricular hypertrophy.  Left ventricular diastolic  parameters are consistent with Grade I diastolic dysfunction (impaired  relaxation).   2. Right ventricular systolic function is moderately reduced. The right  ventricular size is mildly enlarged. Tricuspid regurgitation signal is  inadequate for assessing PA pressure. D-shaped interventricular septum  suggestive of RV pressure/volume  overload.   3. The mitral valve is normal in structure. No evidence of mitral valve  regurgitation. No evidence of mitral stenosis.   4. The aortic valve is tricuspid. Aortic valve regurgitation is trivial.  Mild aortic valve sclerosis is present, with no evidence of aortic valve  stenosis.   5. Aortic dilatation noted. There is mild dilatation of the ascending  aorta, measuring 41 mm.   6. The inferior vena cava is dilated in size with >50% respiratory  variability, suggesting right atrial pressure of 8 mmHg.   7. Technically difficult study, but there is evidence for RV strain on  echo as above.   Patient Profile  Alexander Robles is a 70 y.o. male with hypertension, alcohol use who was admitted on 05/24/2021 with submassive pulmonary embolism.  Assessment & Plan   Submassive pulmonary embolism with cor pulmonale -BNP elevation related to submassive PE.  Troponins negative for ischemia. -We will transition to oral agent.  He is hemodynamically stable. -He will need follow-up with outpatient cardiology.  We will arrange this. -No further cardiac work-up planned.  [facility] will sign off.   Medication Recommendations: Transition to oral anticoagulant as  above Other recommendations (labs, testing, etc): None Follow up as an outpatient: We will arrange follow-up with Dr. Golden Hurter in 2 to 3 months.  He will need a follow-up echocardiogram in 3 to 6 months.  For questions or updates, please contact [place] Please consult [email or web address] for contact info under   Time Spent with Patient: I have spent a total of 15 minutes with patient reviewing hospital notes, telemetry, EKGs, labs and examining the patient as well as establishing an assessment and plan that was discussed with the patient.  > 50% of time was spent in direct patient care.    Signed, Addison Naegeli. Audie Box, MD, [place]  05/26/2021 10:13 AM

## 2021-05-26 NOTE — Plan of Care (Signed)

## 2021-05-27 DIAGNOSIS — I2602 Saddle embolus of pulmonary artery with acute cor pulmonale: Secondary | ICD-10-CM | POA: Diagnosis not present

## 2021-05-27 DIAGNOSIS — J9601 Acute respiratory failure with hypoxia: Secondary | ICD-10-CM | POA: Diagnosis not present

## 2021-05-27 LAB — CBC
HCT: 45.7 % (ref 39.0–52.0)
Hemoglobin: 14.9 g/dL (ref 13.0–17.0)
MCH: 32.7 pg (ref 26.0–34.0)
MCHC: 32.6 g/dL (ref 30.0–36.0)
MCV: 100.2 fL — ABNORMAL HIGH (ref 80.0–100.0)
Platelets: 471 10*3/uL — ABNORMAL HIGH (ref 150–400)
RBC: 4.56 MIL/uL (ref 4.22–5.81)
RDW: 14.1 % (ref 11.5–15.5)
WBC: 9.4 10*3/uL (ref 4.0–10.5)
nRBC: 0 % (ref 0.0–0.2)

## 2021-05-27 LAB — HEPARIN LEVEL (UNFRACTIONATED): Heparin Unfractionated: 0.41 IU/mL (ref 0.30–0.70)

## 2021-05-27 LAB — BASIC METABOLIC PANEL
Anion gap: 8 (ref 5–15)
BUN: 10 mg/dL (ref 8–23)
CO2: 30 mmol/L (ref 22–32)
Calcium: 8.7 mg/dL — ABNORMAL LOW (ref 8.9–10.3)
Chloride: 98 mmol/L (ref 98–111)
Creatinine, Ser: 0.81 mg/dL (ref 0.61–1.24)
GFR, Estimated: >60 mL/min (ref >60)
Glucose, Bld: 91 mg/dL (ref 70–99)
Potassium: 4.3 mmol/L (ref 3.5–5.1)
Sodium: 136 mmol/L (ref 135–145)

## 2021-05-27 NOTE — Progress Notes (Signed)
PROGRESS NOTE    Alexander Robles   IRJ:[id number]  DOB: 1951/05/20  PCP: Wenda Low, MD    DOA: 05/23/2021 LOS: 3   Assessment & Plan   Principal Problem:   Acute saddle pulmonary embolism ([place]) Active Problems:   Acute CHF (congestive heart failure) ([place])   Acute respiratory failure with hypoxia (H[facility])   SOB (shortness of breath)   Prolonged QT interval   Right ventricular enlargement   Cough   Pleurisy   Hypokalemia   Obesity (BMI 30-39.9)   Bilateral lower extremity edema   Acute hypoxic respiratory failure secondary to acute submassive pulmonary embolism Patient reports sedentary lifestyle.  No known malignancies. Hemodynamically stable, O2 needs stable.   Continue Heparin drip. PCCM consulted - see their recs. Transition to [place] prior to d/c. Supplement o2 for sats above 90%.  Cough due to PE.   Scheduled Mucinex, PRN Tussionex, PRN Tessalon  Right Lower Extremity DVT - seen on LE doppler U/S. Anticoagulate as above. Mobilize as tolerated.  Chronic diastolic CHF - mildly decompensated on admission due to acute PE with right heart strain. Presented with dyspnea x 1 week, b/l lower extremity edema, elevated BNP, pulmonary edema on CXR. Treated with IV Lasix in the ED, continued briefly on admision but stopped once PE diagnosed. Strict I/O's, daily weights. Cardiology consulted.   Hypokalemia Replaced.  Monitor, replace PRN for goal K>4   Obesity Body mass index is 36.01 kg/m. Complicates overall care and prognosis.  Recommend lifestyle modifications including physical activity and diet for weight loss and overall long-term health.    Bilateral lower extremity edema Self-reported sedentary lifestyle sitting for 7 to 8 hours/day   Prolonged QTC QTC from admission twelve-lead EKG 505 Avoid QTC prolonging agents Optimize magnesium and potassium levels.    Patient BMI: Body mass index is 36.01 kg/m.   DVT prophylaxis: SCDs Start: 05/24/21  0119   Diet:  Diet Orders (From admission, onward)     Start     Ordered   05/24/21 0226  Diet Heart Room service appropriate? Yes; Fluid consistency: Thin; Fluid restriction: 1800 mL Fluid  Diet effective now       Question Answer Comment  Room service appropriate? Yes   Fluid consistency: Thin   Fluid restriction: 1800 mL Fluid      05/24/21 0225              Code Status: Full Code   Brief Narrative / Hospital Course to Date:   70 y.o. male with medical history significant for essential hypertension, who presented to [facility] ED with complaints of gradually worsening shortness of breath in the last few days, associated with non productive cough and bilateral lower extremity edema which he noted about 2 months ago, improved after stopping alcohol use 3 weeks ago.  Admits to a sedentary lifestyle sitting for 7-8 hours per day due to chronic bilateral knee pain.    He was hypoxic on arrival to the ED and ultimately found to have an acute submassive PE on CTA chest.   Patient admitted to [facility].   Started on heparin drip. Pulmonology consulted.   Patient has been hemodynamically stable and clinically improving with anticoagulation.  No plan for thrombolytics at this time.   Subjective 05/27/21    Pt continues to have cough, not sleeping well due to that.  Reports breathing is better.  Working with therapy and notes improved tolerance for exertion.  No other acute complaints.    Disposition Plan &  Communication   Status is: Inpatient  Remains inpatient appropriate because:IV treatments appropriate due to intensity of illness or inability to take PO  Dispo:  Patient From: Home  Planned Disposition: Home with Health Care Svc  Medically stable for discharge: No      Consults, Procedures, Significant Events   Consultants:  Pulmonology Cardiology  Procedures:  None  Antimicrobials:  Anti-infectives (From admission, onward)    None         Micro    Objective    Vitals:   05/26/21 1955 05/27/21 0340 05/27/21 0800 05/27/21 1131  BP: 125/83 120/87  102/76  Pulse: 67 71  73  Resp: 18 18  20   Temp: 98.5 F (36.9 C) 98.9 F (37.2 C)  98.4 F (36.9 C)  TempSrc: Oral Oral  Oral  SpO2: 99% 93% 95% 97%  Weight:  104.3 kg    Height:        Intake/Output Summary (Last 24 hours) at 05/27/2021 1155 Last data filed at 05/27/2021 6195 Gross per 24 hour  Intake 2128.01 ml  Output 750 ml  Net 1378.01 ml   Filed Weights   05/25/21 0400 05/26/21 0400 05/27/21 0340  Weight: 102.7 kg 103.6 kg 104.3 kg    Physical Exam:  General exam: awake, fatigued appearing, no acute distress, obese Respiratory system: clear, cough triggered by deep insiration, no wheezes, normal respiratory effort at rest Cardiovascular system: normal S1/S2, RRR Central nervous system: no gross focal neurologic deficits, normal speech Extremities moves all, no edema  Labs   Data Reviewed: I have personally reviewed following labs and imaging studies  CBC: Recent Labs  Lab 05/23/21 2051 05/24/21 0337 05/25/21 0334 05/26/21 0551 05/27/21 0324  WBC 10.5 11.4* 9.8 9.9 9.4  NEUTROABS 8.2*  --   --   --   --   HGB 16.6 17.2* 15.3 15.2 14.9  HCT 51.6 51.2 45.9 46.0 45.7  MCV 100.0 97.3 98.1 99.6 100.2*  PLT 457* 481* 458* 468* 093*   Basic Metabolic Panel: Recent Labs  Lab 05/23/21 2051 05/24/21 0337 05/25/21 0334 05/26/21 0551 05/27/21 0324  NA 136 138 140 136 136  K 3.3* 3.3* 3.3* 3.8 4.3  CL 92* 91* 96* 99 98  CO2 33* 36* 34* 30 30  GLUCOSE 111* 104* 98 99 91  BUN 10 9 10 10 10   CREATININE 0.95 0.89 0.89 0.84 0.81  CALCIUM 8.8* 8.8* 8.8* 8.9 8.7*  MG  --  2.1  --   --   --   PHOS  --  4.0  --   --   --    GFR: Estimated Creatinine Clearance: 96.3 mL/min (by C-G formula based on SCr of 0.81 mg/dL). Liver Function Tests: Recent Labs  Lab 05/23/21 2051  AST 25  ALT 24  ALKPHOS 80  BILITOT 1.4*  PROT 7.1  ALBUMIN 3.3*   No results for input(s):  LIPASE, AMYLASE in the last 168 hours. No results for input(s): AMMONIA in the last 168 hours. Coagulation Profile: No results for input(s): INR, PROTIME in the last 168 hours. Cardiac Enzymes: No results for input(s): CKTOTAL, CKMB, CKMBINDEX, TROPONINI in the last 168 hours. BNP (last 3 results) No results for input(s): PROBNP in the last 8760 hours. HbA1C: No results for input(s): HGBA1C in the last 72 hours. CBG: Recent Labs  Lab 05/25/21 2103  GLUCAP 136*   Lipid Profile: Recent Labs    05/26/21 0551  CHOL 142  HDL 42  LDLCALC 83  TRIG  87  CHOLHDL 3.4   Thyroid Function Tests: No results for input(s): TSH, T4TOTAL, FREET4, T3FREE, THYROIDAB in the last 72 hours. Anemia Panel: No results for input(s): VITAMINB12, FOLATE, FERRITIN, TIBC, IRON, RETICCTPCT in the last 72 hours. Sepsis Labs: Recent Labs  Lab 05/23/21 2057 05/24/21 1510 05/24/21 1741 05/25/21 1203  LATICACIDVEN 1.8 2.2* 2.2* 1.4    Recent Results (from the past 240 hour(s))  Blood culture (routine x 2)     Status: None (Preliminary result)   Collection Time: 05/23/21  9:00 PM   Specimen: BLOOD  Result Value Ref Range Status   Specimen Description BLOOD LEFT ANTECUBITAL  Final   Special Requests   Final    BOTTLES DRAWN AEROBIC AND ANAEROBIC Blood Culture results may not be optimal due to an inadequate volume of blood received in culture bottles   Culture   Final    NO GROWTH 3 DAYS Performed at [facility], [place] [address], [place], [place] [address]    Report Status PENDING  Incomplete  Blood culture (routine x 2)     Status: None (Preliminary result)   Collection Time: 05/23/21  9:07 PM   Specimen: BLOOD LEFT HAND  Result Value Ref Range Status   Specimen Description BLOOD LEFT HAND  Final   Special Requests   Final    BOTTLES DRAWN AEROBIC AND ANAEROBIC Blood Culture results may not be optimal due to an inadequate volume of blood received in culture bottles   Culture   Final    NO  GROWTH 3 DAYS Performed at [facility], [place] [address] [place],  [address]    Report Status PENDING  Incomplete  Resp Panel by RT-PCR (Flu A&B, Covid) Nasopharyngeal Swab     Status: None   Collection Time: 05/24/21 12:33 AM   Specimen: Nasopharyngeal Swab; Nasopharyngeal(NP) swabs in vial transport medium  Result Value Ref Range Status   SARS Coronavirus 2 by RT PCR NEGATIVE NEGATIVE Final    Comment: (NOTE) SARS-CoV-2 target nucleic acids are NOT DETECTED.  The SARS-CoV-2 RNA is generally detectable in upper respiratory specimens during the acute phase of infection. The lowest concentration of SARS-CoV-2 viral copies this assay can detect is 138 copies/mL. A negative result does not preclude SARS-Cov-2 infection and should not be used as the sole basis for treatment or other patient management decisions. A negative result may occur with  improper specimen collection/handling, submission of specimen other than nasopharyngeal swab, presence of viral mutation(s) within the areas targeted by this assay, and inadequate number of viral copies(<138 copies/mL). A negative result must be combined with clinical observations, patient history, and epidemiological information. The expected result is Negative.  Fact Sheet for Patients:  EntrepreneurPulse.com.au  Fact Sheet for Healthcare Providers:  IncredibleEmployment.be  This test is no t yet approved or cleared by the [place] FDA and  has been authorized for detection and/or diagnosis of SARS-CoV-2 by FDA under an Emergency Use Authorization (EUA). This EUA will remain  in effect (meaning this test can be used) for the duration of the COVID-19 declaration under Section 564(b)(1) of the Act, 21 U.S.C.section 360bbb-3(b)(1), unless the authorization is terminated  or revoked sooner.       Influenza A by PCR NEGATIVE NEGATIVE Final   Influenza B by PCR NEGATIVE NEGATIVE Final     Comment: (NOTE) The Xpert Xpress SARS-CoV-2/FLU/RSV plus assay is intended as an aid in the diagnosis of influenza from Nasopharyngeal swab specimens and should not be  used as a sole basis for treatment. Nasal washings and aspirates are unacceptable for Xpert Xpress SARS-CoV-2/FLU/RSV testing.  Fact Sheet for Patients: EntrepreneurPulse.com.au  Fact Sheet for Healthcare Providers: IncredibleEmployment.be  This test is not yet approved or cleared by the [place] FDA and has been authorized for detection and/or diagnosis of SARS-CoV-2 by FDA under an Emergency Use Authorization (EUA). This EUA will remain in effect (meaning this test can be used) for the duration of the COVID-19 declaration under Section 564(b)(1) of the Act, 21 U.S.C. section 360bbb-3(b)(1), unless the authorization is terminated or revoked.  Performed at [facility], [place] [address] [place], [place] [address]       Imaging Studies   No results found.   Medications   Scheduled Meds:  dextromethorphan-guaiFENesin  1 tablet Oral BID   multivitamin with minerals  1 tablet Oral Daily   omega-3 acid ethyl esters  1 g Oral Daily   polyethylene glycol  17 g Oral Daily   Continuous Infusions:  heparin 1,950 Units/hr (05/27/21 0619)       LOS: 3 days    Time spent: 25 minutes with > 50% spent at bedside and in coordination of care.    Ezekiel Slocumb, DO [facility]  05/27/2021, 11:55 AM      If 7PM-7AM, please contact night-coverage. How to contact the [facility] Attending or Consulting provider Index or covering provider during after hours [place], for this patient?    Check the care team in [facility] and look for a) attending/consulting TR[facility] provider listed and b) the [facility] team listed Log into [email or web address] and use [facility] universal password to access. If you do not have the password, please contact the hospital operator. Locate the [facility] provider  you are looking for under [facility] and page to a number that you can be directly reached. If you still have difficulty reaching the provider, please page the [facility] (Director on Call) for the Hospitalists listed on amion for assistance.

## 2021-05-27 NOTE — Plan of Care (Signed)

## 2021-05-27 NOTE — Progress Notes (Signed)
ANTICOAGULATION CONSULT NOTE - Follow Up Consult  Pharmacy Consult for Heparin Indication: pulmonary embolus and DVT RLE  No Known Allergies  Patient Measurements: Height: 5\' 7"  (170.2 cm) Weight: 104.3 kg (229 lb 14.4 oz) IBW/kg (Calculated) : 66.1 Heparin Dosing Weight: 78 kg  Vital Signs: Temp: 98.9 F (37.2 C) (07/16 0340) Temp Source: Oral (07/16 0340) BP: 120/87 (07/16 0340) Pulse Rate: 71 (07/16 0340)  Labs: Recent Labs    05/25/21 0334 05/25/21 1203 05/26/21 0551 05/26/21 1133 05/27/21 0324  HGB 15.3  --  15.2  --  14.9  HCT 45.9  --  46.0  --  45.7  PLT 458*  --  468*  --  471*  HEPARINUNFRC 0.32   < > 0.59 0.54 0.41  CREATININE 0.89  --  0.84  --  0.81   < > = values in this interval not displayed.     Estimated Creatinine Clearance: 96.3 mL/min (by C-G formula based on SCr of 0.81 mg/dL).  Assessment: 70 yr old male admitted last night with shortness of breath and leg swelling. CTA 05/24/21 shows submasssive PE with right heart strain. Pharmacy consulted for IV heparin. Not on anticoagulants prior to admission. Duplex 7/14 shows RLE DVT of indeterminate age.  Heparin level remains therapeutic (0.41), on heparin drip at 1950 units/hr.  CBC stable. No s/sx of bleeding or infusion issues per nursing.    Goal of Therapy:  Heparin level 0.3-0.7 units/ml Monitor platelets by anticoagulation protocol: Yes   Plan:  Continue heparin drip at 1950 units/hr Daily heparin level and CBC. Will follow up for transition to oral agent.  Antonietta Jewel, PharmD, [place] Clinical Pharmacist  Phone: [phone] 05/27/2021 7:52 AM  Please check AMION for all [place] phone numbers After 10:00 PM, call [place] [phone]

## 2021-05-27 NOTE — ED Provider Notes (Addendum)
[place]    CSN: [id number] Arrival date & time: 05/23/21  1807      History   Chief Complaint Chief Complaint  Patient presents with   Shortness of Breath    HPI Alexander Robles is a 70 y.o. male.   Patient presenting today with increasingly worsening shortness of breath for 1 week.  He has had a cough throughout this time.  He also notes that his ankles have been swelling bilaterally for the past few months, but much worse the past month.  He started having severe back pain with coughing the past 24 hours or so which is why he came in today.  He had walking pneumonia per patient last year that felt similar so he is seeking treatment for this.  He denies fever, congestion, GI symptoms, headache, dizziness, chest pain.  Has not been trying anything over-the-counter for symptoms thus far.  Past medical history significant for hypertension, heavy alcohol use (patient stopped drinking 2 to 3 weeks ago reportedly), hyperlipidemia, chronic lower extremity edema.     Past Medical History:  Diagnosis Date   Carpal tunnel syndrome    Cubital tunnel syndrome on right    Hypertension    states under control with meds., has been on med. x 5 yr.   Skin disease, fungal    on back   Wears partial dentures    upper    Patient Active Problem List   Diagnosis Date Noted   Cough 05/25/2021   Pleurisy 05/25/2021   Hypokalemia 05/25/2021   Obesity (BMI 30-39.9) 05/25/2021   Bilateral lower extremity edema 05/25/2021   Prolonged QT interval 05/25/2021   Acute CHF (congestive heart failure) ([place]) 05/24/2021   Acute respiratory failure with hypoxia ([place]) 05/24/2021   Acute saddle pulmonary embolism ([facility]) 05/24/2021   SOB (shortness of breath)    Right ventricular enlargement     Past Surgical History:  Procedure Laterality Date   CARPAL TUNNEL RELEASE Right 08/02/2017   Procedure: RIGHT CARPAL TUNNEL RELEASE;  Surgeon: Daryll Brod, MD;  Location: [facility];   Service: Orthopedics;  Laterality: Right;  AX BLOCK   COLONOSCOPY     ULNAR NERVE TRANSPOSITION Right 08/02/2017   Procedure: RIGHT ULNAR NERVE DECOMPRESSION;  Surgeon: Daryll Brod, MD;  Location: [facility];  Service: Orthopedics;  Laterality: Right;       Home Medications    Prior to Admission medications   Medication Sig Start Date End Date Taking? Authorizing Provider  acetaminophen (TYLENOL) 500 MG tablet Take 500 mg by mouth every 6 (six) hours as needed for moderate pain or headache.    [provider]  amLODipine (NORVASC) 10 MG tablet Take 10 mg by mouth daily.    [provider]  doxazosin (CARDURA) 8 MG tablet Take 8 mg by mouth at bedtime.    [provider]  guaiFENesin (ROBITUSSIN) 100 MG/5ML SOLN Take 10 mLs by mouth every 4 (four) hours as needed for cough or to loosen phlegm.    [provider]  Melatonin 10 MG TABS Take 10 mg by mouth at bedtime as needed (sleep).    [provider]  Multiple Vitamins-Minerals (CENTRUM SILVER 50+MEN) TABS Take 1 tablet by mouth daily.    [provider]  Omega-3 Fatty Acids (FISH OIL) 1000 MG CPDR Take 1,000 mg by mouth daily.    [provider]  Tetrahydrozoline HCl (VISINE OP) Place 1 drop into both eyes daily as needed (red  itchy eyes).    [provider]    Family History History reviewed. No pertinent family history.  Social History Social History   Tobacco Use   Smoking status: Never   Smokeless tobacco: Never  Vaping Use   Vaping Use: Never used  Substance Use Topics   Alcohol use: Not Currently    Comment: 7 drinks/week.  05/23/2021-states he stopped drinking 2 weeks ago   Drug use: No     Allergies   Patient has no known allergies.   Review of Systems Review of Systems Per HPI  Physical Exam Triage Vital Signs ED Triage Vitals  Enc Vitals Group     BP 05/23/21 1856 127/85     Pulse Rate 05/23/21 1856 94     Resp  05/23/21 1856 (!) 32     Temp 05/23/21 1856 97.8 F (36.6 C)     Temp Source 05/23/21 1856 Oral     SpO2 05/23/21 1856 91 %     Weight --      Height --      Head Circumference --      Peak Flow --      Pain Score 05/23/21 1850 10     Pain Loc --      Pain Edu? --      Excl. in [place]? --    No data found.  Updated Vital Signs BP 127/85   Pulse 94   Temp 97.8 F (36.6 C) (Oral)   Resp (!) 32   SpO2 91%   Visual Acuity Right Eye Distance:   Left Eye Distance:   Bilateral Distance:    Right Eye Near:   Left Eye Near:    Bilateral Near:     Physical Exam Vitals and nursing note reviewed.  Constitutional:      Appearance: Normal appearance. He is obese. He is ill-appearing.  HENT:     Head: Atraumatic.     Nose: Nose normal.     Mouth/Throat:     Mouth: Mucous membranes are moist.  Eyes:     Extraocular Movements: Extraocular movements intact.     Conjunctiva/sclera: Conjunctivae normal.     Pupils: Pupils are equal, round, and reactive to light.  Cardiovascular:     Rate and Rhythm: Normal rate and regular rhythm.  Pulmonary:     Breath sounds: Rales present.     Comments: Tachypneic, shallow breaths Musculoskeletal:     Cervical back: Normal range of motion and neck supple.     Right lower leg: Edema present.     Left lower leg: Edema present.     Comments: In wheelchair today, unclear baseline ambulation status  Skin:    General: Skin is warm and dry.     Findings: No bruising.  Neurological:     General: No focal deficit present.     Mental Status: He is alert and oriented to person, place, and time.  Psychiatric:        Mood and Affect: Mood normal.        Thought Content: Thought content normal.        Judgment: Judgment normal.     UC Treatments / Results  Labs (all labs ordered are listed, but only abnormal results are displayed) Labs Reviewed - No data to display  EKG   Radiology VAS [place] LOWER EXTREMITY VENOUS (DVT)  Result Date:  05/25/2021  Lower Venous DVT Study Patient Name:  Ward  Date of Exam:  05/25/2021 Medical Rec #: [id number]         Accession #:    [id number] Date of Birth: [birth year]         Patient Gender: M Patient Age:   69Y Exam Location:  [facility] Procedure:      VAS [place] LOWER EXTREMITY VENOUS (DVT) Referring Phys: [id number] [place] --------------------------------------------------------------------------------  Indications: Edema.  Comparison Study: no prior Performing Technologist: Archie Patten RVS  Examination Guidelines: A complete evaluation includes B-mode imaging, spectral Doppler, color Doppler, and power Doppler as needed of all accessible portions of each vessel. Bilateral testing is considered an integral part of a complete examination. Limited examinations for reoccurring indications may be performed as noted. The reflux portion of the exam is performed with the patient in reverse Trendelenburg.  +---------+---------------+---------+-----------+----------+-----------------+ RIGHT    CompressibilityPhasicitySpontaneityPropertiesThrombus Aging    +---------+---------------+---------+-----------+----------+-----------------+ CFV      Partial        Yes      Yes                  Age Indeterminate +---------+---------------+---------+-----------+----------+-----------------+ SFJ      Full                                                           +---------+---------------+---------+-----------+----------+-----------------+ FV Prox  None                                         Age Indeterminate +---------+---------------+---------+-----------+----------+-----------------+ FV Mid   None                                         Age Indeterminate +---------+---------------+---------+-----------+----------+-----------------+ FV DistalNone                                         Age Indeterminate  +---------+---------------+---------+-----------+----------+-----------------+ PFV      Full                                                           +---------+---------------+---------+-----------+----------+-----------------+ POP      None           No       No                   Age Indeterminate +---------+---------------+---------+-----------+----------+-----------------+ PTV      Partial                                      Age Indeterminate +---------+---------------+---------+-----------+----------+-----------------+ PERO     None  Age Indeterminate +---------+---------------+---------+-----------+----------+-----------------+   +---------+---------------+---------+-----------+----------+--------------+ LEFT     CompressibilityPhasicitySpontaneityPropertiesThrombus Aging +---------+---------------+---------+-----------+----------+--------------+ CFV      Full           Yes      Yes                                 +---------+---------------+---------+-----------+----------+--------------+ SFJ      Full                                                        +---------+---------------+---------+-----------+----------+--------------+ FV Prox  Full                                                        +---------+---------------+---------+-----------+----------+--------------+ FV Mid   Full                                                        +---------+---------------+---------+-----------+----------+--------------+ FV DistalFull                                                        +---------+---------------+---------+-----------+----------+--------------+ PFV      Full                                                        +---------+---------------+---------+-----------+----------+--------------+ POP      Full           Yes      Yes                                  +---------+---------------+---------+-----------+----------+--------------+ PTV      Full                                                        +---------+---------------+---------+-----------+----------+--------------+ PERO     Full                                                        +---------+---------------+---------+-----------+----------+--------------+     Summary: RIGHT: - Findings consistent with age indeterminate deep vein thrombosis involving the right common femoral vein, right femoral vein, right popliteal vein, right posterior tibial veins, and right peroneal veins. - A cystic structure is found  in the popliteal fossa.  LEFT: - There is no evidence of deep vein thrombosis in the lower extremity.  - No cystic structure found in the popliteal fossa.  *See table(s) above for measurements and observations. Electronically signed by Jamelle Haring on 05/25/2021 at 5:10:39 PM.    Final     Procedures Procedures (including critical care time)  Medications Ordered in UC Medications - No data to display  Initial Impression / Assessment and Plan / UC Course  I have reviewed the triage vital signs and the nursing notes.  Pertinent labs & imaging results that were available during my care of the patient were reviewed by me and considered in my medical decision making (see chart for details).     Patient appears in mild respiratory distress, he is tachypneic at 32 respirations per minute and oxygen saturation oscillating between 89 and 91% on room air.  He states he does not have any oxygen requirements at baseline.  Exam findings significant for shallow, tachypneic breathing with rales present bilaterally and significant bilateral lower extremity edema.  Strongly suspect he is in acute CHF.  Recommended he go to the emergency department for further evaluation and immediate management given the severity of his condition.  He is initially resistant, but provided work note for his friend  who drove him here today so that they could stay at the emergency department with him as long as needed.  He declines EMS transport today and wishes for his friend to take him via private vehicle.  Report called to [facility], ED.    Final Clinical Impressions(s) / UC Diagnoses   Final diagnoses:  Shortness of breath  Tachypnea  Bilateral leg edema   Discharge Instructions   None    ED Prescriptions   None    PDMP not reviewed this encounter.   Volney American, [place] 05/27/21 [place], [place], PA-C 05/27/21 [id number]

## 2021-05-27 NOTE — Progress Notes (Signed)
Occupational Therapy Treatment Patient Details Name: Alexander Robles MRN: [id number] DOB: 1951/01/27 Today's Date: 05/27/2021    History of present illness 70 yo male [facility] with SOB, coughing x1 week. Workup for new onset CHF. Chest CTA showed acute submassive bilateral PEs with RVE and right heart strain. Pt admitted with acute respiratory failure due to submassive PE. PMH includes carpal tunnel with R release 2018, HTN, obesity.   OT comments  Pt admitted with with above.  Pt reported they were feeling much better but the cough has limited their ability to sleep at this time. Pt was able to complete sit to stand transfers with supervision, functional mobility with RW to bathroom with supervision to min guard due to line management. Pt was able to complete toilet tasks and hygiene with supervision. Pt then completed hygiene at sink level with supervision of UE and LE. Pt currently with functional limitations due to the deficits listed below (see OT Problem List).  Pt will benefit from skilled OT to increase their safety and independence with ADL and functional mobility for ADL to facilitate discharge to venue listed below.    Follow Up Recommendations  Home health OT    Equipment Recommendations  Tub/shower bench;Other (comment)    Recommendations for Other Services      Precautions / Restrictions Precautions Precautions: Fall Restrictions Weight Bearing Restrictions: No       Mobility Bed Mobility Overal bed mobility: Needs Assistance Bed Mobility: Rolling;Supine to Sit Rolling: Supervision (HOB elevaated)   Supine to sit: HOB elevated          Transfers Overall transfer level: Needs assistance Equipment used: Rolling walker (2 wheeled) Transfers: Sit to/from Stand Sit to Stand: Supervision              Balance Overall balance assessment: Needs assistance Sitting-balance support: Feet supported Sitting balance-Leahy Scale: Good                                      ADL either performed or assessed with clinical judgement   ADL Overall ADL's : Needs assistance/impaired Eating/Feeding: Independent;Sitting   Grooming: Wash/dry hands;Wash/dry face;Min guard;Standing   Upper Body Bathing: Set up;Supervision/ safety;Sitting   Lower Body Bathing: Min guard;Sit to/from stand   Upper Body Dressing : Supervision/safety;Sitting;Standing;Cueing for safety;Cueing for sequencing   Lower Body Dressing: Supervision/safety;Cueing for safety;Cueing for sequencing;Sit to/from stand   Toilet Transfer: Supervision/safety;Cueing for safety;Cueing for sequencing;Ambulation;RW;Regular Toilet;Grab bars   Toileting- Clothing Manipulation and Hygiene: Min guard;Cueing for safety;Cueing for sequencing;Sit to/from stand       Functional mobility during ADLs: Supervision/safety;Cueing for safety;Cueing for sequencing;Rolling walker       Vision       Perception     Praxis      Cognition Arousal/Alertness: Awake/alert Behavior During Therapy: WFL for tasks assessed/performed Overall Cognitive Status: Within Functional Limits for tasks assessed                                          Exercises     Shoulder Instructions       General Comments Pt on 5L of o2 and with tank on 6L via [place] with o2 in 90s% but noted one occaion to 80s but appeared to be poor reading    Pertinent Vitals/  Pain       Pain Assessment: No/denies pain  Home Living                                          Prior Functioning/Environment              Frequency  Min 2X/week        Progress Toward Goals  OT Goals(current goals can now be found in the care plan section)  Progress towards OT goals: Progressing toward goals  Acute Rehab OT Goals Patient Stated Goal: to get some rest OT Goal Formulation: With patient Time For Goal Achievement: 06/09/21 Potential to Achieve Goals: Good ADL  Goals Pt Will Perform Grooming: with supervision;with set-up;standing Pt Will Perform Lower Body Bathing: with supervision;with set-up Pt Will Perform Lower Body Dressing: with supervision;with set-up Pt Will Transfer to Toilet: with supervision;with modified independence;ambulating Pt Will Perform Toileting - Clothing Manipulation and hygiene: with supervision;with modified independence;sit to/from stand  Plan Discharge plan remains appropriate    Co-evaluation                 AM-PAC OT "6 Clicks" Daily Activity     Outcome Measure   Help from another person eating meals?: None Help from another person taking care of personal grooming?: A Little Help from another person toileting, which includes using toliet, bedpan, or urinal?: A Little Help from another person bathing (including washing, rinsing, drying)?: A Little Help from another person to put on and taking off regular upper body clothing?: A Little Help from another person to put on and taking off regular lower body clothing?: A Little 6 Click Score: 19    End of Session Equipment Utilized During Treatment: Gait belt;Rolling walker  OT Visit Diagnosis: Unsteadiness on feet (R26.81);Other abnormalities of gait and mobility (R26.89);Muscle weakness (generalized) (M62.81);Pain Pain - Right/Left: Right   Activity Tolerance Patient tolerated treatment well   Patient Left in bed;with call bell/phone within reach   Nurse Communication Other (comment) (lead pads)        Time: 8937-3428 OT Time Calculation (min): 44 min  Charges: OT General Charges $OT Visit: 1 Visit OT Treatments $Self Care/Home Management : 38-52 mins  Joeseph Amor OTR/L  [place]  [phone] office number [phone] pager number    Joeseph Amor 05/27/2021, 9:59 AM

## 2021-05-28 DIAGNOSIS — I2602 Saddle embolus of pulmonary artery with acute cor pulmonale: Secondary | ICD-10-CM | POA: Diagnosis not present

## 2021-05-28 LAB — CBC
HCT: 46.2 % (ref 39.0–52.0)
Hemoglobin: 15.1 g/dL (ref 13.0–17.0)
MCH: 33 pg (ref 26.0–34.0)
MCHC: 32.7 g/dL (ref 30.0–36.0)
MCV: 100.9 fL — ABNORMAL HIGH (ref 80.0–100.0)
Platelets: 450 10*3/uL — ABNORMAL HIGH (ref 150–400)
RBC: 4.58 MIL/uL (ref 4.22–5.81)
RDW: 14.1 % (ref 11.5–15.5)
WBC: 9 10*3/uL (ref 4.0–10.5)
nRBC: 0 % (ref 0.0–0.2)

## 2021-05-28 LAB — CULTURE, BLOOD (ROUTINE X 2)
Culture: NO GROWTH
Culture: NO GROWTH

## 2021-05-28 LAB — BASIC METABOLIC PANEL
Anion gap: 4 — ABNORMAL LOW (ref 5–15)
BUN: 8 mg/dL (ref 8–23)
CO2: 31 mmol/L (ref 22–32)
Calcium: 8.8 mg/dL — ABNORMAL LOW (ref 8.9–10.3)
Chloride: 100 mmol/L (ref 98–111)
Creatinine, Ser: 0.85 mg/dL (ref 0.61–1.24)
GFR, Estimated: >60 mL/min (ref >60)
Glucose, Bld: 107 mg/dL — ABNORMAL HIGH (ref 70–99)
Potassium: 4.6 mmol/L (ref 3.5–5.1)
Sodium: 135 mmol/L (ref 135–145)

## 2021-05-28 LAB — HEPARIN LEVEL (UNFRACTIONATED): Heparin Unfractionated: 0.4 IU/mL (ref 0.30–0.70)

## 2021-05-28 MED ORDER — APIXABAN 5 MG PO TABS: 5.0000 mg | ORAL_TABLET | Freq: Two times a day (BID) | ORAL | Status: DC

## 2021-05-28 MED ORDER — APIXABAN 5 MG PO TABS
10.0000 mg | ORAL_TABLET | Freq: Two times a day (BID) | ORAL | Status: DC
  Administered 2021-05-28 – 2021-05-29 (×2): 10 mg via ORAL
  Filled 2021-05-28 (×3): qty 2

## 2021-05-28 NOTE — Progress Notes (Signed)
PROGRESS NOTE    Alexander Robles   KGS:[id number]  DOB: 10/24/1951  PCP: Wenda Low, MD    DOA: 05/23/2021 LOS: 4   Assessment & Plan   Principal Problem:   Acute saddle pulmonary embolism ([place]) Active Problems:   Acute CHF (congestive heart failure) ([place])   Acute respiratory failure with hypoxia (HCC)   SOB (shortness of breath)   Prolonged QT interval   Right ventricular enlargement   Cough   Pleurisy   Hypokalemia   Obesity (BMI 30-39.9)   Bilateral lower extremity edema   Acute hypoxic respiratory failure secondary to acute submassive pulmonary embolism Patient reports sedentary lifestyle.  No known malignancies. Hemodynamically stable, O2 needs stable.   7/17 - on 5 L/min o2 with 99% sats with talking and bed mobility.  Turned o2 to 4 L/min, sats remained 98-99% --WEAN Oxygen as tolerated PCCM consulted - see their recs. Transition to Eliquis Stop heparin after Eliquis given Supplement o2 for sats above 90%   Cough due to PE.   Scheduled Mucinex, PRN Tussionex, PRN Tessalon  Right Lower Extremity DVT - seen on LE doppler U/S. Anticoagulate as above. Mobilize as tolerated.  Chronic diastolic CHF - mildly decompensated on admission due to acute PE with right heart strain. Presented with dyspnea x 1 week, b/l lower extremity edema, elevated BNP, pulmonary edema on CXR. Treated with IV Lasix in the ED, continued briefly on admision but stopped once PE diagnosed. Strict I/O's, daily weights. Cardiology consulted.   Hypokalemia Replaced.  Monitor, replace PRN for goal K>4   Obesity Body mass index is 36.15 kg/m. Complicates overall care and prognosis.  Recommend lifestyle modifications including physical activity and diet for weight loss and overall long-term health.    Bilateral lower extremity edema Self-reported sedentary lifestyle sitting for 7 to 8 hours/day   Prolonged QTC QTC from admission twelve-lead EKG 505 Avoid QTC prolonging  agents Optimize magnesium and potassium levels.    Patient BMI: Body mass index is 36.15 kg/m.   DVT prophylaxis: SCDs Start: 05/24/21 0119 apixaban (ELIQUIS) tablet 10 mg  apixaban (ELIQUIS) tablet 5 mg   Diet:  Diet Orders (From admission, onward)     Start     Ordered   05/24/21 0226  Diet Heart Room service appropriate? Yes; Fluid consistency: Thin; Fluid restriction: 1800 mL Fluid  Diet effective now       Question Answer Comment  Room service appropriate? Yes   Fluid consistency: Thin   Fluid restriction: 1800 mL Fluid      05/24/21 0225              Code Status: Full Code   Brief Narrative / Hospital Course to Date:   70 y.o. male with medical history significant for essential hypertension, who presented to [facility] ED with complaints of gradually worsening shortness of breath in the last few days, associated with non productive cough and bilateral lower extremity edema which he noted about 2 months ago, improved after stopping alcohol use 3 weeks ago.  Admits to a sedentary lifestyle sitting for 7-8 hours per day due to chronic bilateral knee pain.    He was hypoxic on arrival to the ED and ultimately found to have an acute submassive PE on CTA chest.   Patient admitted to [facility].   Started on heparin drip. Pulmonology consulted.   Patient has been hemodynamically stable and clinically improving with anticoagulation.  No plan for thrombolytics at this time.   Subjective 05/28/21  Pt reports ongoing cough.  SOB better.  No CP or other acute complaints.  We discussed o2 requirements.  He really does not want to go home with oxygen.    Disposition Plan & Communication   Status is: Inpatient  Remains inpatient appropriate because:IV treatments appropriate due to intensity of illness or inability to take PO  Dispo:  Patient From: Home  Planned Disposition: Home with Health Care Svc  Medically stable for discharge: No      Consults, Procedures, Significant  Events   Consultants:  Pulmonology Cardiology  Procedures:  None  Antimicrobials:  Anti-infectives (From admission, onward)    None         Micro    Objective   Vitals:   05/27/21 1131 05/27/21 1949 05/28/21 0400 05/28/21 0843  BP: 102/76 124/79 118/73 140/76  Pulse: 73 83 70 84  Resp: 20 17 20 20   Temp: 98.4 F (36.9 C) (!) 97.5 F (36.4 C) 98 F (36.7 C) 98.3 F (36.8 C)  TempSrc: Oral Oral Oral Oral  SpO2: 97% 95% 96% 96%  Weight:   104.7 kg   Height:        Intake/Output Summary (Last 24 hours) at 05/28/2021 1554 Last data filed at 05/28/2021 1250 Gross per 24 hour  Intake 835 ml  Output 1850 ml  Net -1015 ml   Filed Weights   05/26/21 0400 05/27/21 0340 05/28/21 0400  Weight: 103.6 kg 104.3 kg 104.7 kg    Physical Exam:  General exam: awake & alert, no acute distress, obese Respiratory system: clear bilaterally, better inspirations, no wheezes, normal respiratory effort at rest, on 5 L/min [place] O2 with 99% spO2 on monitor (turned to 4 L/min) Cardiovascular system: normal S1/S2, RRR Central nervous system: no gross focal neurologic deficits, normal speech Extremities moves all, normal tone  Labs   Data Reviewed: I have personally reviewed following labs and imaging studies  CBC: Recent Labs  Lab 05/23/21 2051 05/24/21 0337 05/25/21 0334 05/26/21 0551 05/27/21 0324 05/28/21 0223  WBC 10.5 11.4* 9.8 9.9 9.4 9.0  NEUTROABS 8.2*  --   --   --   --   --   HGB 16.6 17.2* 15.3 15.2 14.9 15.1  HCT 51.6 51.2 45.9 46.0 45.7 46.2  MCV 100.0 97.3 98.1 99.6 100.2* 100.9*  PLT 457* 481* 458* 468* 471* 564*   Basic Metabolic Panel: Recent Labs  Lab 05/24/21 0337 05/25/21 0334 05/26/21 0551 05/27/21 0324 05/28/21 0223  NA 138 140 136 136 135  K 3.3* 3.3* 3.8 4.3 4.6  CL 91* 96* 99 98 100  CO2 36* 34* 30 30 31   GLUCOSE 104* 98 99 91 107*  BUN 9 10 10 10 8   CREATININE 0.89 0.89 0.84 0.81 0.85  CALCIUM 8.8* 8.8* 8.9 8.7* 8.8*  MG 2.1  --   --    --   --   PHOS 4.0  --   --   --   --    GFR: Estimated Creatinine Clearance: 91.9 mL/min (by C-G formula based on SCr of 0.85 mg/dL). Liver Function Tests: Recent Labs  Lab 05/23/21 2051  AST 25  ALT 24  ALKPHOS 80  BILITOT 1.4*  PROT 7.1  ALBUMIN 3.3*   No results for input(s): LIPASE, AMYLASE in the last 168 hours. No results for input(s): AMMONIA in the last 168 hours. Coagulation Profile: No results for input(s): INR, PROTIME in the last 168 hours. Cardiac Enzymes: No results for input(s): CKTOTAL, CKMB, CKMBINDEX, TROPONINI  in the last 168 hours. BNP (last 3 results) No results for input(s): PROBNP in the last 8760 hours. HbA1C: No results for input(s): HGBA1C in the last 72 hours. CBG: Recent Labs  Lab 05/25/21 2103  GLUCAP 136*   Lipid Profile: Recent Labs    05/26/21 0551  CHOL 142  HDL 42  LDLCALC 83  TRIG 87  CHOLHDL 3.4   Thyroid Function Tests: No results for input(s): TSH, T4TOTAL, FREET4, T3FREE, THYROIDAB in the last 72 hours. Anemia Panel: No results for input(s): VITAMINB12, FOLATE, FERRITIN, TIBC, IRON, RETICCTPCT in the last 72 hours. Sepsis Labs: Recent Labs  Lab 05/23/21 2057 05/24/21 1510 05/24/21 1741 05/25/21 1203  LATICACIDVEN 1.8 2.2* 2.2* 1.4    Recent Results (from the past 240 hour(s))  Blood culture (routine x 2)     Status: None (Preliminary result)   Collection Time: 05/23/21  9:00 PM   Specimen: BLOOD  Result Value Ref Range Status   Specimen Description BLOOD LEFT ANTECUBITAL  Final   Special Requests   Final    BOTTLES DRAWN AEROBIC AND ANAEROBIC Blood Culture results may not be optimal due to an inadequate volume of blood received in culture bottles   Culture   Final    NO GROWTH 4 DAYS Performed at [facility], [place] [address] [place], [place] [address]    Report Status PENDING  Incomplete  Blood culture (routine x 2)     Status: None (Preliminary result)   Collection Time: 05/23/21  9:07 PM    Specimen: BLOOD LEFT HAND  Result Value Ref Range Status   Specimen Description BLOOD LEFT HAND  Final   Special Requests   Final    BOTTLES DRAWN AEROBIC AND ANAEROBIC Blood Culture results may not be optimal due to an inadequate volume of blood received in culture bottles   Culture   Final    NO GROWTH 4 DAYS Performed at [facility], [place] [address], [place], [place] [address]    Report Status PENDING  Incomplete  Resp Panel by RT-PCR (Flu A&B, Covid) Nasopharyngeal Swab     Status: None   Collection Time: 05/24/21 12:33 AM   Specimen: Nasopharyngeal Swab; Nasopharyngeal(NP) swabs in vial transport medium  Result Value Ref Range Status   SARS Coronavirus 2 by RT PCR NEGATIVE NEGATIVE Final    Comment: (NOTE) SARS-CoV-2 target nucleic acids are NOT DETECTED.  The SARS-CoV-2 RNA is generally detectable in upper respiratory specimens during the acute phase of infection. The lowest concentration of SARS-CoV-2 viral copies this assay can detect is 138 copies/mL. A negative result does not preclude SARS-Cov-2 infection and should not be used as the sole basis for treatment or other patient management decisions. A negative result may occur with  improper specimen collection/handling, submission of specimen other than nasopharyngeal swab, presence of viral mutation(s) within the areas targeted by this assay, and inadequate number of viral copies(<138 copies/mL). A negative result must be combined with clinical observations, patient history, and epidemiological information. The expected result is Negative.  Fact Sheet for Patients:  EntrepreneurPulse.com.au  Fact Sheet for Healthcare Providers:  IncredibleEmployment.be  This test is no t yet approved or cleared by the [place] FDA and  has been authorized for detection and/or diagnosis of SARS-CoV-2 by FDA under an Emergency Use Authorization (EUA). This EUA will remain  in effect  (meaning this test can be used) for the duration of the COVID-19 declaration under Section 564(b)(1) of the Act, 21  U.S.C.section 360bbb-3(b)(1), unless the authorization is terminated  or revoked sooner.       Influenza A by PCR NEGATIVE NEGATIVE Final   Influenza B by PCR NEGATIVE NEGATIVE Final    Comment: (NOTE) The Xpert Xpress SARS-CoV-2/FLU/RSV plus assay is intended as an aid in the diagnosis of influenza from Nasopharyngeal swab specimens and should not be used as a sole basis for treatment. Nasal washings and aspirates are unacceptable for Xpert Xpress SARS-CoV-2/FLU/RSV testing.  Fact Sheet for Patients: EntrepreneurPulse.com.au  Fact Sheet for Healthcare Providers: IncredibleEmployment.be  This test is not yet approved or cleared by the [place] FDA and has been authorized for detection and/or diagnosis of SARS-CoV-2 by FDA under an Emergency Use Authorization (EUA). This EUA will remain in effect (meaning this test can be used) for the duration of the COVID-19 declaration under Section 564(b)(1) of the Act, 21 U.S.C. section 360bbb-3(b)(1), unless the authorization is terminated or revoked.  Performed at [facility], [place] [address], [place], [place] [address]       Imaging Studies   No results found.   Medications   Scheduled Meds:  apixaban  10 mg Oral BID   Followed by   Derrill Memo ON 06/04/2021] apixaban  5 mg Oral BID   dextromethorphan-guaiFENesin  1 tablet Oral BID   multivitamin with minerals  1 tablet Oral Daily   omega-3 acid ethyl esters  1 g Oral Daily   polyethylene glycol  17 g Oral Daily   Continuous Infusions:  heparin 1,950 Units/hr (05/28/21 0652)       LOS: 4 days    Time spent: 25 minutes with > 50% spent at bedside and in coordination of care.    Ezekiel Slocumb, DO [facility]  05/28/2021, 3:54 PM      If 7PM-7AM, please contact night-coverage. How to contact  the [facility] Attending or Consulting provider [place] or covering provider during after hours [place], for this patient?    Check the care team in [facility] and look for a) attending/consulting TRH provider listed and b) the [facility] team listed Log into [email or web address] and use [place]'s universal password to access. If you do not have the password, please contact the hospital operator. Locate the [facility] provider you are looking for under [facility] and page to a number that you can be directly reached. If you still have difficulty reaching the provider, please page the [facility] (Director on Call) for the Hospitalists listed on amion for assistance.

## 2021-05-28 NOTE — Progress Notes (Signed)
ANTICOAGULATION CONSULT NOTE - Follow Up Consult  Pharmacy Consult for Heparin Indication: pulmonary embolus and DVT RLE  No Known Allergies  Patient Measurements: Height: 5\' 7"  (170.2 cm) Weight: 104.7 kg (230 lb 12.8 oz) IBW/kg (Calculated) : 66.1 Heparin Dosing Weight: 78 kg  Vital Signs: Temp: 98 F (36.7 C) (07/17 0400) Temp Source: Oral (07/17 0400) BP: 118/73 (07/17 0400) Pulse Rate: 70 (07/17 0400)  Labs: Recent Labs    05/26/21 0551 05/26/21 1133 05/27/21 0324 05/28/21 0223  HGB 15.2  --  14.9 15.1  HCT 46.0  --  45.7 46.2  PLT 468*  --  471* 450*  HEPARINUNFRC 0.59 0.54 0.41 0.40  CREATININE 0.84  --  0.81 0.85     Estimated Creatinine Clearance: 91.9 mL/min (by C-G formula based on SCr of 0.85 mg/dL).  Assessment: 70 yr old male admitted last night with shortness of breath and leg swelling. CTA 05/24/21 shows submasssive PE with right heart strain. Pharmacy consulted for IV heparin. Not on anticoagulants prior to admission. Duplex 7/14 shows RLE DVT of indeterminate age.  Heparin level remains therapeutic (0.40), on heparin drip at 1950 units/hr.  CBC stable. Per conversation with patient, no s/sx of bleeding and no complaints other than his cough.                                                   Goal of Therapy:  Heparin level 0.3-0.7 units/ml Monitor platelets by anticoagulation protocol: Yes   Plan:  Continue heparin drip at 1950 units/hr Daily heparin level and CBC. Will follow up for transition to oral agent.  Pauletta Browns, Pharm.D. PGY-1 Ambulatory Care Resident [phone] 05/28/2021 8:17 AM   Please check AMION for all [place] phone numbers After 10:00 PM, call [place] [phone]

## 2021-05-29 ENCOUNTER — Other Ambulatory Visit (HOSPITAL_COMMUNITY): Payer: Self-pay

## 2021-05-29 DIAGNOSIS — R600 Localized edema: Secondary | ICD-10-CM

## 2021-05-29 LAB — CBC
HCT: 46.1 % (ref 39.0–52.0)
Hemoglobin: 14.7 g/dL (ref 13.0–17.0)
MCH: 32.2 pg (ref 26.0–34.0)
MCHC: 31.9 g/dL (ref 30.0–36.0)
MCV: 101.1 fL — ABNORMAL HIGH (ref 80.0–100.0)
Platelets: 469 10*3/uL — ABNORMAL HIGH (ref 150–400)
RBC: 4.56 MIL/uL (ref 4.22–5.81)
RDW: 14.1 % (ref 11.5–15.5)
WBC: 8.3 10*3/uL (ref 4.0–10.5)
nRBC: 0 % (ref 0.0–0.2)

## 2021-05-29 LAB — BASIC METABOLIC PANEL
Anion gap: 9 (ref 5–15)
BUN: 10 mg/dL (ref 8–23)
CO2: 26 mmol/L (ref 22–32)
Calcium: 9 mg/dL (ref 8.9–10.3)
Chloride: 101 mmol/L (ref 98–111)
Creatinine, Ser: 0.93 mg/dL (ref 0.61–1.24)
GFR, Estimated: >60 mL/min (ref >60)
Glucose, Bld: 101 mg/dL — ABNORMAL HIGH (ref 70–99)
Potassium: 4.5 mmol/L (ref 3.5–5.1)
Sodium: 136 mmol/L (ref 135–145)

## 2021-05-29 MED ORDER — DM-GUAIFENESIN ER 30-600 MG PO TB12
1.0000 | ORAL_TABLET | Freq: Two times a day (BID) | ORAL | 1 refills | Status: DC
  Filled 2021-05-29: qty 30, 15d supply, fill #0

## 2021-05-29 MED ORDER — BENZOCAINE-MENTHOL 15-2.6 MG MT LOZG
1.0000 | LOZENGE | OROMUCOSAL | 12 refills | Status: DC | PRN
  Filled 2021-05-29: qty 16, 16d supply, fill #0

## 2021-05-29 MED ORDER — HYDROCOD POLST-CPM POLST ER 10-8 MG/5ML PO SUER
5.0000 mL | Freq: Two times a day (BID) | ORAL | 0 refills | Status: DC | PRN
  Filled 2021-05-29: qty 140, 14d supply, fill #0

## 2021-05-29 MED ORDER — APIXABAN 5 MG PO TABS
ORAL_TABLET | ORAL | 2 refills | Status: DC
  Filled 2021-05-29: qty 60, 25d supply, fill #0
  Filled 2021-06-07: qty 60, 30d supply, fill #0
  Filled 2021-06-13: qty 60, 5d supply, fill #0
  Filled 2021-06-13: qty 60, 30d supply, fill #0
  Filled 2021-08-02: qty 60, 30d supply, fill #1

## 2021-05-29 MED ORDER — BENZONATATE 200 MG PO CAPS
200.0000 mg | ORAL_CAPSULE | Freq: Three times a day (TID) | ORAL | 0 refills | Status: DC | PRN
  Filled 2021-05-29: qty 20, 7d supply, fill #0

## 2021-05-29 NOTE — Care Management Important Message (Signed)
Important Message  Patient Details  Name: Alexander Robles MRN: [id number] Date of Birth: 29-Sep-1951   Medicare Important Message Given:  Yes     [name] 05/29/2021, 10:46 AM

## 2021-05-29 NOTE — TOC Transition Note (Addendum)
Transition of Care [facility]) - CM/SW Discharge Note   Patient Details  Name: Alexander Robles MRN: [id number] Date of Birth: 21-Jun-1951  Transition of Care [employer]) CM/SW Contact:  Zenon Mayo, RN Phone Number: 05/29/2021, 1:28 PM   Clinical Narrative:    NCM spoke with patient at bedside, offered choice for HHPT, HHOT, he states he does not have a preference. NCM made referral to [facility] with [place].  She is able to take referral.  Soc will begin 24 to 48 hrs post dc.  He will also need home oxygen and rolling walker, he is ok with [employer] supplying DME for him.  He states he will have transportation home at dc.  He has PCP, [place]. He has no medication coverage but he does have Medicare. NCM informed MD of this information.  He only has a pharmacy discount card.  He has a quad cane at home and crutches.  NCM informed patient about enrolling in Medicare part D for medication coverage when the enrollment period comes around again in October.  He states he understands.  NCM gave pharmacy the discount information for his medications.  [employer] company is sending patient out patient ast application.   Final next level of care: [place] Barriers to Discharge: No Barriers Identified   Patient Goals and CMS Choice Patient states their goals for this hospitalization and ongoing recovery are:: return home CMS Medicare.gov Compare Post Acute Care list provided to:: Patient Choice offered to / list presented to : Patient  Discharge Placement                       Discharge Plan and Services                DME Arranged: Walker rolling, Oxygen DME Agency: [employer] Date DME Agency Contacted: 05/29/21 Time DME Agency Contacted: [phone] Representative spoke with at DME Agency: [place]: PT, OT [place] Agency: [place] Date [place]: 05/29/21 Time [place]: 0102 Representative spoke with at [place]: [place] ([place]) Interventions     Readmission Risk Interventions No flowsheet data found.

## 2021-05-29 NOTE — TOC Progression Note (Signed)
Transition of Care [facility]) - Progression Note    Patient Details  Name: Alexander Robles MRN: [id number] Date of Birth: 26-Apr-1951  Transition of Care [facility]) CM/SW Contact  Zenon Mayo, RN Phone Number: 05/29/2021, 12:20 PM  Clinical Narrative:    Patient has optum perks, Discount Card for savings on prescriptions, BIN [id number], PCN [facility], [place], auth number [id number].        Expected Discharge Plan and Services                                                 Social Determinants of Health (SDOH) Interventions    Readmission Risk Interventions No flowsheet data found.

## 2021-05-29 NOTE — Discharge Summary (Signed)
Physician Discharge Summary  Alexander Robles ZDG:[id number] DOB: 12/18/1950 DOA: 05/23/2021  PCP: Wenda Low, MD  Admit date: 05/23/2021 Discharge date: 05/29/2021  Admitted From: home Disposition:  home  Recommendations for Outpatient Follow-up:  Follow up with PCP in 1-2 weeks Please obtain BMP/CBC in one week Please follow up on patient's need for supplemental oxygen. At time of discharges, requires 3 L/min.  Follow up on Eliquis cost - pt rec'd first 30 day supply and given information for the Patient Assistance Program offered by the manufacturer.   Home Health: PT, OT  Equipment/Devices: Oxygen   Discharge Condition: Stable  CODE STATUS: Full  Diet recommendation: Heart Healthy     Discharge Diagnoses: Principal Problem:   Acute saddle pulmonary embolism (HCC) Active Problems:   Acute CHF (congestive heart failure) (HCC)   Acute respiratory failure with hypoxia (HCC)   SOB (shortness of breath)   Prolonged QT interval   Right ventricular enlargement   Cough   Pleurisy   Hypokalemia   Obesity (BMI 30-39.9)   Bilateral lower extremity edema    Summary of HPI and Hospital Course:  70 y.o. male with medical history significant for essential hypertension, who presented to [facility] ED with complaints of gradually worsening shortness of breath in the last few days, associated with non productive cough and bilateral lower extremity edema which he noted about 2 months ago, improved after stopping alcohol use 3 weeks ago.  Admits to a sedentary lifestyle sitting for 7-8 hours per day due to chronic bilateral knee pain.     He was hypoxic on arrival to the ED and ultimately found to have an acute submassive PE on CTA chest.   Patient admitted to [facility].   Started on heparin drip. Pulmonology consulted.   Patient has been hemodynamically stable and clinically improved with anticoagulation.  Thrombolytics were not required.      Acute hypoxic respiratory failure secondary  to acute submassive pulmonary embolism Patient reports sedentary lifestyle.  No known malignancies. Hemodynamically stable, O2 needs stable.   7/17 - on 5 L/min o2 with 99% sats with talking and bed mobility.  Turned o2 to 4 L/min, sats remained 98-99% PCCM consulted - see their recs. Treated with heparin  Transitioned to Eliquis Supplement o2 for sats above 90% Required oxygen at d/c, instructed on weaning as tolerated.  Eliquis - provided first 30 days and given Patient Assistance Program details from [employer].  Requires 3 l/min oxygen with ambulating.  Home oxygen was ordered.  PT and OT evaluated and recommend [facility] therapy after discharge which was arranged.   Patient is clinically improved and stable for discharge home today.     Cough due to PE.   Scheduled Mucinex, PRN Tussionex, PRN Tessalon   Right Lower Extremity DVT - seen on LE doppler U/S. Anticoagulate as above. Mobilize as tolerated.   Chronic diastolic CHF - mildly decompensated on admission due to acute PE with right heart strain. Presented with dyspnea x 1 week, b/l lower extremity edema, elevated BNP, pulmonary edema on CXR. Treated with IV Lasix in the ED, continued briefly on admision but stopped once PE diagnosed. Strict I/O's, daily weights. Cardiology consulted, see their recs.   Hypokalemia Replaced.  Monitor, replace PRN for goal K>4   Obesity Body mass index is 36.15 kg/m. Complicates overall care and prognosis.  Recommend lifestyle modifications including physical activity and diet for weight loss and overall long-term health.     Bilateral lower extremity edema Self-reported sedentary lifestyle sitting  for 7 to 8 hours/day Elevate legs when in chair.   Prolonged QTC QTC from admission twelve-lead EKG 505 Avoid QTC prolonging agents Monitor and replace K and Mg   Discharge Instructions   Discharge Instructions     (HEART FAILURE PATIENTS) Call MD:  Anytime you have any of the following  symptoms: 1) 3 pound weight gain in 24 hours or 5 pounds in 1 week 2) shortness of breath, with or without a dry hacking cough 3) swelling in the hands, feet or stomach 4) if you have to sleep on extra pillows at night in order to breathe.   Complete by: As directed    Amb Referral to HF Clinic   Complete by: As directed    Call MD for:  extreme fatigue   Complete by: As directed    Call MD for:  persistant dizziness or light-headedness   Complete by: As directed    Call MD for:  persistant nausea and vomiting   Complete by: As directed    Call MD for:  severe uncontrolled pain   Complete by: As directed    Call MD for:  temperature >100.4   Complete by: As directed    Diet - low sodium heart healthy   Complete by: As directed    Discharge instructions   Complete by: As directed    OXYGEN --- Continue to use oxygen AS NEEDED.   The goal oxygen "saturation" level we want is 92% or higher. If you are in upper 90's, you can turn the oxygen down or take it off. You might find that you are able to come off of oxygen while you're at rest, but may need it longer with physical exertion / walking around.  ELIQUIS --- blood thinner medication. We were able to get your first 30 days from our [facility] pharmacy here. The manufacturer of Eliquis (apixaban) has a Patient Assistance Program that you likely qualify for. You have to apply for the program with the company, so please do that as soon as possible.   You can Google "Eliquis Patient Assistance Program" - or start here:  Http://eliquis.bmscustomerconnect.com  or CALL 1-855-ELIQUIS 934-391-9286).   Increase activity slowly   Complete by: As directed       Allergies as of 05/29/2021   No Known Allergies      Medication List     STOP taking these medications    amLODipine 10 MG tablet Commonly known as: NORVASC   doxazosin 8 MG tablet Commonly known as: CARDURA   guaiFENesin 100 MG/5ML Soln Commonly known as:  ROBITUSSIN   Melatonin 10 MG Tabs       TAKE these medications    acetaminophen 500 MG tablet Commonly known as: TYLENOL Take 500 mg by mouth every 6 (six) hours as needed for moderate pain or headache.   apixaban 5 MG Tabs tablet Commonly known as: ELIQUIS Take 2 tablets (10 mg total) by mouth 2 (two) times daily for 5 days, THEN 1 tablet (5 mg total) 2 (two) times daily for 25 days. Start taking on: May 29, 2021   benzonatate 200 MG capsule Commonly known as: TESSALON Take 1 capsule (200 mg total) by mouth 3 (three) times daily as needed for cough.   Centrum Silver 50+Men Tabs Take 1 tablet by mouth daily.   chlorpheniramine-HYDROcodone 10-8 MG/5ML Suer Commonly known as: TUSSIONEX Take 5 mLs by mouth every 12 (twelve) hours as needed for cough.   dextromethorphan-guaiFENesin 30-600 MG 12hr  tablet Commonly known as: MUCINEX DM Take 1 tablet by mouth 2 (two) times daily.   Fish Oil 1000 MG Cpdr Take 1,000 mg by mouth daily.   menthol-cetylpyridinium 3 MG lozenge Commonly known as: CEPACOL Take 1 lozenge (3 mg total) by mouth as needed for sore throat.   VISINE OP Place 1 drop into both eyes daily as needed (red itchy eyes).               Durable Medical Equipment  (From admission, onward)           Start     Ordered   05/29/21 1404  For home use only DME oxygen  Once       Question Answer Comment  Length of Need 6 Months   Mode or (Route) Nasal cannula   Liters per Minute 3   Frequency Continuous (stationary and portable oxygen unit needed)   Oxygen delivery system Gas      05/29/21 1404   05/29/21 1217  For home use only DME Tub bench  Once        05/29/21 1216   05/27/21 0802  For home use only DME Walker rolling  Once       Question Answer Comment  Walker: With [employer] Wheels   Patient needs a walker to treat with the following condition Generalized weakness      05/27/21 0801            Follow-up Information     Werner Lean, MD Follow up.   Specialty: Cardiology Why: Hospital follow-up with Cardiology scheduled for 08/11/2021 at 8:40am. Please arrive 15 minutes early for check-in. If this date/time does not work for you, please call our office to reschedule. Contact information: [address] [place] [address] [phone]         [facility] [place] Follow up.   Specialty: [place] Why: HHPT,HHOT Contact information: [place] [address] [place] [facility] Follow up.   Why: walker,oxygen Contact information: [place] [address] [phone]                No Known Allergies   If you experience worsening of your admission symptoms, develop shortness of breath, life threatening emergency, suicidal or homicidal thoughts you must seek medical attention immediately by calling 911 or calling your MD immediately  if symptoms less severe.    Please note   You were cared for by a hospitalist during your hospital stay. If you have any questions about your discharge medications or the care you received while you were in the hospital after you are discharged, you can call the unit and asked to speak with the hospitalist on call if the hospitalist that took care of you is not available. Once you are discharged, your primary care physician will handle any further medical issues. Please note that NO REFILLS for any discharge medications will be authorized once you are discharged, as it is imperative that you return to your primary care physician (or establish a relationship with a primary care physician if you do not have one) for your aftercare needs so that they can reassess your need for medications and monitor your lab values.   Consultations: PCCM   Procedures/Studies: DG Chest 2 View  Result Date: 05/23/2021 CLINICAL DATA:  Shortness of breath which increases movement for 1 week, cough  for 1 week, bilateral ankle swelling EXAM: CHEST - 2 VIEW COMPARISON:  Radiograph 08/25/2020 FINDINGS: Hazy perihilar and basilar predominant opacities present in both lungs. Pulmonary vascular congestion and redistribution. Prominent cardiac silhouette, may be accentuated by AP technique. No visible layering effusion or discernible pneumothorax. No acute osseous or soft tissue abnormality. Degenerative changes are present in the imaged spine and shoulders. IMPRESSION: Hazy perihilar and basilar opacities, may reflect atelectasis or developing pulmonary edema with additional features which could suggest some degree heart failure or volume overload including vascular congestion and enlarged cardiac silhouette. Electronically Signed   By: Lovena Le M.D.   On: 05/23/2021 22:02   CT Angio Chest Pulmonary Embolism (PE) W or WO Contrast  Result Date: 05/24/2021 CLINICAL DATA:  Shortness of breath.  Pulmonary emboli suspected. EXAM: CT ANGIOGRAPHY CHEST WITH CONTRAST TECHNIQUE: Multidetector CT imaging of the chest was performed using the standard protocol during bolus administration of intravenous contrast. Multiplanar CT image reconstructions and MIPs were obtained to evaluate the vascular anatomy. CONTRAST:  46mL OMNIPAQUE IOHEXOL 350 MG/ML SOLN COMPARISON:  Chest radiography same day FINDINGS: Cardiovascular: The heart is enlarged. Right ventricle is enlarged relative to the left, suggesting right ventricular strain. No pericardial effusion. Pulmonary arterial opacification is early but satisfactory. There is massive bilateral pulmonary embolism, more extensive on the right than the left. Mediastinum/Nodes: No mass or lymphadenopathy. Lungs/Pleura: Patchy density in both lungs that could be atelectasis or early pulmonary infarction. No pleural effusion. Upper Abdomen: Likely 1 cm cyst in the right lobe of the liver. Musculoskeletal: Ordinary thoracic degenerative changes. Review of the MIP images confirms the  above findings. IMPRESSION: Positive for acute PE with CT evidence of right heart strain consistent with at least submassive (intermediate risk) PE. The presence of right heart strain has been associated with an increased risk of morbidity and mortality. Please refer to the "PE Focused" order set in EPIC. Call report in progress. Electronically Signed   By: Nelson Chimes M.D.   On: 05/24/2021 08:35   DG CHEST PORT 1 VIEW  Result Date: 05/24/2021 CLINICAL DATA:  70 year old male with shortness of breath. EXAM: PORTABLE CHEST 1 VIEW COMPARISON:  Chest radiographs 05/23/2021 and earlier. FINDINGS: Portable AP semi upright view at 0516 hours. Continued low lung volumes. Stable cardiac size and mediastinal contours. Questionable cardiomegaly. No pneumothorax or consolidation. Mildly increased interstitial markings in both lungs. Mild patchy opacity at the left lung base. No definite effusion. Negative visible bowel gas. No acute osseous abnormality identified. IMPRESSION: Continued low lung volumes. Increased interstitial markings and patchy left lung base opacity might simply reflect atelectasis, but mild or developing interstitial edema or infection are difficult to exclude. Electronically Signed   By: Genevie Ann M.D.   On: 05/24/2021 05:39   ECHOCARDIOGRAM COMPLETE  Result Date: 05/25/2021    ECHOCARDIOGRAM REPORT   Patient Name:   Alexander Robles Date of Exam: 05/24/2021 Medical Rec #:  [id number]        Height:       67.0 in Accession #:    [id number]       Weight:       224.4 lb Date of Birth:  05/30/1951        BSA:          2.124 m Patient Age:    1 years         BP:           107/73 mmHg Patient Gender: M  HR:           75 bpm. Exam Location:  Inpatient Procedure: 2D Echo, Cardiac Doppler and Color Doppler                    STAT ECHO Reported to: Dr Aundra Dubin on 05/24/2021 12:01:00 PM. Indications:     I26.02 Pulmonary embolus  History:         Patient has no prior history of Echocardiogram  examinations.                  Risk Factors:Hypertension.  Sonographer:     Jonelle Sidle Dance Referring Phys:  [id number] Kayleen Memos Diagnosing Phys: Loralie Champagne MD IMPRESSIONS  1. Left ventricular ejection fraction, by estimation, is 60 to 65%. The left ventricle has normal function. The left ventricle has no regional wall motion abnormalities. There is moderate left ventricular hypertrophy. Left ventricular diastolic parameters are consistent with Grade I diastolic dysfunction (impaired relaxation).  2. Right ventricular systolic function is moderately reduced. The right ventricular size is mildly enlarged. Tricuspid regurgitation signal is inadequate for assessing PA pressure. D-shaped interventricular septum suggestive of RV pressure/volume overload.  3. The mitral valve is normal in structure. No evidence of mitral valve regurgitation. No evidence of mitral stenosis.  4. The aortic valve is tricuspid. Aortic valve regurgitation is trivial. Mild aortic valve sclerosis is present, with no evidence of aortic valve stenosis.  5. Aortic dilatation noted. There is mild dilatation of the ascending aorta, measuring 41 mm.  6. The inferior vena cava is dilated in size with >50% respiratory variability, suggesting right atrial pressure of 8 mmHg.  7. Technically difficult study, but there is evidence for RV strain on echo as above. FINDINGS  Left Ventricle: Left ventricular ejection fraction, by estimation, is 60 to 65%. The left ventricle has normal function. The left ventricle has no regional wall motion abnormalities. Definity contrast agent was given IV to delineate the left ventricular  endocardial borders. The left ventricular internal cavity size was normal in size. There is moderate left ventricular hypertrophy. Left ventricular diastolic parameters are consistent with Grade I diastolic dysfunction (impaired relaxation). Right Ventricle: The right ventricular size is mildly enlarged. No increase in right  ventricular wall thickness. Right ventricular systolic function is moderately reduced. Tricuspid regurgitation signal is inadequate for assessing PA pressure. Left Atrium: Left atrial size was normal in size. Right Atrium: Right atrial size was normal in size. Pericardium: Trivial pericardial effusion is present. Mitral Valve: The mitral valve is normal in structure. Mild mitral annular calcification. No evidence of mitral valve regurgitation. No evidence of mitral valve stenosis. Tricuspid Valve: The tricuspid valve is not well visualized. Tricuspid valve regurgitation is not demonstrated. Aortic Valve: The aortic valve is tricuspid. Aortic valve regurgitation is trivial. Mild aortic valve sclerosis is present, with no evidence of aortic valve stenosis. Pulmonic Valve: The pulmonic valve was normal in structure. Pulmonic valve regurgitation is trivial. Aorta: Aortic dilatation noted. There is mild dilatation of the ascending aorta, measuring 41 mm. Venous: The inferior vena cava is dilated in size with greater than 50% respiratory variability, suggesting right atrial pressure of 8 mmHg. IAS/Shunts: No atrial level shunt detected by color flow Doppler.  LEFT VENTRICLE PLAX 2D LVIDd:         3.80 cm  Diastology LVIDs:         2.65 cm  LV e' medial:    6.34 cm/s LV PW:  1.30 cm  LV E/e' medial:  5.9 LV IVS:        1.50 cm  LV e' lateral:   8.39 cm/s LVOT diam:     2.30 cm  LV E/e' lateral: 4.4 LV SV:         51 LV SV Index:   24 LVOT Area:     4.15 cm  RIGHT VENTRICLE             IVC RV Basal diam:  3.00 cm     IVC diam: 1.10 cm RV S prime:     15.50 cm/s TAPSE (M-mode): 2.3 cm LEFT ATRIUM             Index       RIGHT ATRIUM           Index LA diam:        3.70 cm 1.74 cm/m  RA Area:     17.00 cm LA Vol (A2C):   58.7 ml 27.64 ml/m RA Volume:   37.90 ml  17.85 ml/m LA Vol (A4C):   50.1 ml 23.59 ml/m LA Biplane Vol: 56.0 ml 26.37 ml/m  AORTIC VALVE LVOT Vmax:   82.80 cm/s LVOT Vmean:  52.500 cm/s LVOT  VTI:    0.123 m  AORTA Ao Root diam: 3.80 cm Ao Asc diam:  4.10 cm MITRAL VALVE MV Area (PHT): 2.62 cm    SHUNTS MV Decel Time: 289 msec    Systemic VTI:  0.12 m MV E velocity: 37.30 cm/s  Systemic Diam: 2.30 cm MV A velocity: 70.80 cm/s MV E/A ratio:  0.53 Loralie Champagne MD Electronically signed by Loralie Champagne MD Signature Date/Time: 05/24/2021/12:46:10 PM    Final (Updated)    VAS [place] LOWER EXTREMITY VENOUS (DVT)  Result Date: 05/25/2021  Lower Venous DVT Study Patient Name:  Alexander Robles  Date of Exam:   05/25/2021 Medical Rec #: [id number]         Accession #:    [id number] Date of Birth: 1951/04/12         Patient Gender: M Patient Age:   69Y Exam Location:  [facility] Procedure:      VAS [place] LOWER EXTREMITY VENOUS (DVT) Referring Phys: [id number] Ada --------------------------------------------------------------------------------  Indications: Edema.  Comparison Study: no prior Performing Technologist: Archie Patten RVS  Examination Guidelines: A complete evaluation includes B-mode imaging, spectral Doppler, color Doppler, and power Doppler as needed of all accessible portions of each vessel. Bilateral testing is considered an integral part of a complete examination. Limited examinations for reoccurring indications may be performed as noted. The reflux portion of the exam is performed with the patient in reverse Trendelenburg.  +---------+---------------+---------+-----------+----------+-----------------+ RIGHT    CompressibilityPhasicitySpontaneityPropertiesThrombus Aging    +---------+---------------+---------+-----------+----------+-----------------+ CFV      Partial        Yes      Yes                  Age Indeterminate +---------+---------------+---------+-----------+----------+-----------------+ SFJ      Full                                                           +---------+---------------+---------+-----------+----------+-----------------+ FV Prox  None  Age Indeterminate +---------+---------------+---------+-----------+----------+-----------------+ FV Mid   None                                         Age Indeterminate +---------+---------------+---------+-----------+----------+-----------------+ FV DistalNone                                         Age Indeterminate +---------+---------------+---------+-----------+----------+-----------------+ PFV      Full                                                           +---------+---------------+---------+-----------+----------+-----------------+ POP      None           No       No                   Age Indeterminate +---------+---------------+---------+-----------+----------+-----------------+ PTV      Partial                                      Age Indeterminate +---------+---------------+---------+-----------+----------+-----------------+ PERO     None                                         Age Indeterminate +---------+---------------+---------+-----------+----------+-----------------+   +---------+---------------+---------+-----------+----------+--------------+ LEFT     CompressibilityPhasicitySpontaneityPropertiesThrombus Aging +---------+---------------+---------+-----------+----------+--------------+ CFV      Full           Yes      Yes                                 +---------+---------------+---------+-----------+----------+--------------+ SFJ      Full                                                        +---------+---------------+---------+-----------+----------+--------------+ FV Prox  Full                                                        +---------+---------------+---------+-----------+----------+--------------+ FV Mid   Full                                                        +---------+---------------+---------+-----------+----------+--------------+ FV DistalFull                                                         +---------+---------------+---------+-----------+----------+--------------+  PFV      Full                                                        +---------+---------------+---------+-----------+----------+--------------+ POP      Full           Yes      Yes                                 +---------+---------------+---------+-----------+----------+--------------+ PTV      Full                                                        +---------+---------------+---------+-----------+----------+--------------+ PERO     Full                                                        +---------+---------------+---------+-----------+----------+--------------+     Summary: RIGHT: - Findings consistent with age indeterminate deep vein thrombosis involving the right common femoral vein, right femoral vein, right popliteal vein, right posterior tibial veins, and right peroneal veins. - A cystic structure is found in the popliteal fossa.  LEFT: - There is no evidence of deep vein thrombosis in the lower extremity.  - No cystic structure found in the popliteal fossa.  *See table(s) above for measurements and observations. Electronically signed by Jamelle Haring on 05/25/2021 at 5:10:39 PM.    Final        Subjective: Pt seen this AM, up in recliner.  Reports still feeling more weak than baseline but slowly improving.  No chest pain.  SOB is improving.  Tolerating ambulation with walker with therapy.  No other acute complaints.   Discharge Exam: Vitals:   05/29/21 0533 05/29/21 1057  BP: 122/86 (!) 134/93  Pulse: 71 86  Resp: 20 20  Temp: 98.6 F (37 C) 98.3 F (36.8 C)  SpO2: 97% 97%   Vitals:   05/28/21 2032 05/29/21 0256 05/29/21 0533 05/29/21 1057  BP: 121/79  122/86 (!) 134/93  Pulse: 76  71 86  Resp: 18  20 20   Temp: 98.9 F (37.2 C)  98.6 F (37 C) 98.3 F (36.8 C)  TempSrc: Oral  Oral Oral  SpO2: 96%  97% 97%  Weight:   104.8 kg    Height:        General: Pt is alert, awake, not in acute distress Cardiovascular: RRR, S1/S2 +, no rubs, no gallops Respiratory: CTA bilaterally, no wheezing, no rhonchi Abdominal: Soft, NT, ND, bowel sounds + Extremities: trace LE edema, no cyanosis    The results of significant diagnostics from this hospitalization (including imaging, microbiology, ancillary and laboratory) are listed below for reference.     Microbiology: Recent Results (from the past 240 hour(s))  Blood culture (routine x 2)     Status: None   Collection Time: 05/23/21  9:00 PM   Specimen: BLOOD  Result Value Ref Range Status  Specimen Description BLOOD LEFT ANTECUBITAL  Final   Special Requests   Final    BOTTLES DRAWN AEROBIC AND ANAEROBIC Blood Culture results may not be optimal due to an inadequate volume of blood received in culture bottles   Culture   Final    NO GROWTH 5 DAYS Performed at [facility], [place] [address] [place], [place] [address]    Report Status 05/28/2021 FINAL  Final  Blood culture (routine x 2)     Status: None   Collection Time: 05/23/21  9:07 PM   Specimen: BLOOD LEFT HAND  Result Value Ref Range Status   Specimen Description BLOOD LEFT HAND  Final   Special Requests   Final    BOTTLES DRAWN AEROBIC AND ANAEROBIC Blood Culture results may not be optimal due to an inadequate volume of blood received in culture bottles   Culture   Final    NO GROWTH 5 DAYS Performed at [facility], [place] [address] [place], [place] [address]    Report Status 05/28/2021 FINAL  Final  Resp Panel by RT-PCR (Flu A&B, Covid) Nasopharyngeal Swab     Status: None   Collection Time: 05/24/21 12:33 AM   Specimen: Nasopharyngeal Swab; Nasopharyngeal(NP) swabs in vial transport medium  Result Value Ref Range Status   SARS Coronavirus 2 by RT PCR NEGATIVE NEGATIVE Final    Comment: (NOTE) SARS-CoV-2 target nucleic acids are NOT DETECTED.  The SARS-CoV-2 RNA is generally  detectable in upper respiratory specimens during the acute phase of infection. The lowest concentration of SARS-CoV-2 viral copies this assay can detect is 138 copies/mL. A negative result does not preclude SARS-Cov-2 infection and should not be used as the sole basis for treatment or other patient management decisions. A negative result may occur with  improper specimen collection/handling, submission of specimen other than nasopharyngeal swab, presence of viral mutation(s) within the areas targeted by this assay, and inadequate number of viral copies(<138 copies/mL). A negative result must be combined with clinical observations, patient history, and epidemiological information. The expected result is Negative.  Fact Sheet for Patients:  EntrepreneurPulse.com.au  Fact Sheet for Healthcare Providers:  IncredibleEmployment.be  This test is no t yet approved or cleared by the [place] FDA and  has been authorized for detection and/or diagnosis of SARS-CoV-2 by FDA under an Emergency Use Authorization (EUA). This EUA will remain  in effect (meaning this test can be used) for the duration of the COVID-19 declaration under Section 564(b)(1) of the Act, 21 U.S.C.section 360bbb-3(b)(1), unless the authorization is terminated  or revoked sooner.       Influenza A by PCR NEGATIVE NEGATIVE Final   Influenza B by PCR NEGATIVE NEGATIVE Final    Comment: (NOTE) The Xpert Xpress SARS-CoV-2/FLU/RSV plus assay is intended as an aid in the diagnosis of influenza from Nasopharyngeal swab specimens and should not be used as a sole basis for treatment. Nasal washings and aspirates are unacceptable for Xpert Xpress SARS-CoV-2/FLU/RSV testing.  Fact Sheet for Patients: EntrepreneurPulse.com.au  Fact Sheet for Healthcare Providers: IncredibleEmployment.be  This test is not yet approved or cleared by the [place] FDA  and has been authorized for detection and/or diagnosis of SARS-CoV-2 by FDA under an Emergency Use Authorization (EUA). This EUA will remain in effect (meaning this test can be used) for the duration of the COVID-19 declaration under Section 564(b)(1) of the Act, 21 U.S.C. section 360bbb-3(b)(1), unless the authorization is terminated or revoked.  Performed at [facility], [place] [address] [place], [place] [address]      Labs: BNP (last 3 results) Recent Labs    05/23/21 2051 05/24/21 0337  BNP 614.0* 387.5*   Basic Metabolic Panel: Recent Labs  Lab 05/24/21 0337 05/25/21 0334 05/26/21 0551 05/27/21 0324 05/28/21 0223 05/29/21 0238  NA 138 140 136 136 135 136  K 3.3* 3.3* 3.8 4.3 4.6 4.5  CL 91* 96* 99 98 100 101  CO2 36* 34* 30 30 31 26   GLUCOSE 104* 98 99 91 107* 101*  BUN 9 10 10 10 8 10   CREATININE 0.89 0.89 0.84 0.81 0.85 0.93  CALCIUM 8.8* 8.8* 8.9 8.7* 8.8* 9.0  MG 2.1  --   --   --   --   --   PHOS 4.0  --   --   --   --   --    Liver Function Tests: Recent Labs  Lab 05/23/21 2051  AST 25  ALT 24  ALKPHOS 80  BILITOT 1.4*  PROT 7.1  ALBUMIN 3.3*   No results for input(s): LIPASE, AMYLASE in the last 168 hours. No results for input(s): AMMONIA in the last 168 hours. CBC: Recent Labs  Lab 05/23/21 2051 05/24/21 0337 05/25/21 0334 05/26/21 0551 05/27/21 0324 05/28/21 0223 05/29/21 0238  WBC 10.5   < > 9.8 9.9 9.4 9.0 8.3  NEUTROABS 8.2*  --   --   --   --   --   --   HGB 16.6   < > 15.3 15.2 14.9 15.1 14.7  HCT 51.6   < > 45.9 46.0 45.7 46.2 46.1  MCV 100.0   < > 98.1 99.6 100.2* 100.9* 101.1*  PLT 457*   < > 458* 468* 471* 450* 469*   < > = values in this interval not displayed.   Cardiac Enzymes: No results for input(s): CKTOTAL, CKMB, CKMBINDEX, TROPONINI in the last 168 hours. BNP: Invalid input(s): POCBNP CBG: Recent Labs  Lab 05/25/21 2103  GLUCAP 136*   D-Dimer No results for input(s): DDIMER in the last 72  hours. Hgb A1c No results for input(s): HGBA1C in the last 72 hours. Lipid Profile No results for input(s): CHOL, HDL, LDLCALC, TRIG, CHOLHDL, LDLDIRECT in the last 72 hours. Thyroid function studies No results for input(s): TSH, T4TOTAL, T3FREE, THYROIDAB in the last 72 hours.  Invalid input(s): FREET3 Anemia work up No results for input(s): VITAMINB12, FOLATE, FERRITIN, TIBC, IRON, RETICCTPCT in the last 72 hours. Urinalysis No results found for: COLORURINE, APPEARANCEUR, [place], [place], [place], [place], [place], [place], PROTEINUR, UROBILINOGEN, NITRITE, LEUKOCYTESUR Sepsis Labs Invalid input(s): PROCALCITONIN,  WBC,  LACTICIDVEN Microbiology Recent Results (from the past 240 hour(s))  Blood culture (routine x 2)     Status: None   Collection Time: 05/23/21  9:00 PM   Specimen: BLOOD  Result Value Ref Range Status   Specimen Description BLOOD LEFT ANTECUBITAL  Final   Special Requests   Final    BOTTLES DRAWN AEROBIC AND ANAEROBIC Blood Culture results may not be optimal due to an inadequate volume of blood received in culture bottles   Culture   Final    NO GROWTH 5 DAYS Performed at [facility], [place] [address] [place], [place] [address]    Report Status 05/28/2021 FINAL  Final  Blood culture (routine x 2)     Status: None   Collection Time: 05/23/21  9:07 PM   Specimen: BLOOD LEFT HAND  Result Value Ref Range  Status   Specimen Description BLOOD LEFT HAND  Final   Special Requests   Final    BOTTLES DRAWN AEROBIC AND ANAEROBIC Blood Culture results may not be optimal due to an inadequate volume of blood received in culture bottles   Culture   Final    NO GROWTH 5 DAYS Performed at [facility], [place] [address] [place], [place] [address]    Report Status 05/28/2021 FINAL  Final  Resp Panel by RT-PCR (Flu A&B, Covid) Nasopharyngeal Swab     Status: None   Collection Time: 05/24/21 12:33 AM   Specimen: Nasopharyngeal Swab; Nasopharyngeal(NP) swabs in  vial transport medium  Result Value Ref Range Status   SARS Coronavirus 2 by RT PCR NEGATIVE NEGATIVE Final    Comment: (NOTE) SARS-CoV-2 target nucleic acids are NOT DETECTED.  The SARS-CoV-2 RNA is generally detectable in upper respiratory specimens during the acute phase of infection. The lowest concentration of SARS-CoV-2 viral copies this assay can detect is 138 copies/mL. A negative result does not preclude SARS-Cov-2 infection and should not be used as the sole basis for treatment or other patient management decisions. A negative result may occur with  improper specimen collection/handling, submission of specimen other than nasopharyngeal swab, presence of viral mutation(s) within the areas targeted by this assay, and inadequate number of viral copies(<138 copies/mL). A negative result must be combined with clinical observations, patient history, and epidemiological information. The expected result is Negative.  Fact Sheet for Patients:  EntrepreneurPulse.com.au  Fact Sheet for Healthcare Providers:  IncredibleEmployment.be  This test is no t yet approved or cleared by the [place] FDA and  has been authorized for detection and/or diagnosis of SARS-CoV-2 by FDA under an Emergency Use Authorization (EUA). This EUA will remain  in effect (meaning this test can be used) for the duration of the COVID-19 declaration under Section 564(b)(1) of the Act, 21 U.S.C.section 360bbb-3(b)(1), unless the authorization is terminated  or revoked sooner.       Influenza A by PCR NEGATIVE NEGATIVE Final   Influenza B by PCR NEGATIVE NEGATIVE Final    Comment: (NOTE) The Xpert Xpress SARS-CoV-2/FLU/RSV plus assay is intended as an aid in the diagnosis of influenza from Nasopharyngeal swab specimens and should not be used as a sole basis for treatment. Nasal washings and aspirates are unacceptable for Xpert Xpress SARS-CoV-2/FLU/RSV testing.  Fact  Sheet for Patients: EntrepreneurPulse.com.au  Fact Sheet for Healthcare Providers: IncredibleEmployment.be  This test is not yet approved or cleared by the [place] FDA and has been authorized for detection and/or diagnosis of SARS-CoV-2 by FDA under an Emergency Use Authorization (EUA). This EUA will remain in effect (meaning this test can be used) for the duration of the COVID-19 declaration under Section 564(b)(1) of the Act, 21 U.S.C. section 360bbb-3(b)(1), unless the authorization is terminated or revoked.  Performed at [facility], [place] [address] [place], [place] [address]      Time coordinating discharge: Over 30 minutes  SIGNED:   Ezekiel Slocumb, DO [facility] 05/29/2021, 2:27 PM   If 7PM-7AM, please contact night-coverage [email or web address]

## 2021-05-29 NOTE — Progress Notes (Signed)
PT Cancellation Note  Patient Details Name: Alexander Robles MRN: [id number] DOB: 1951-04-23   Cancelled Treatment:    Reason Eval/Treat Not Completed: Other (comment) - Pt receiving meds upon PT first attempt, up with mobility specialist on second attempt, and sleeping soundly and not awoken by PT loudly speaking name on third attempt. PT to check back as schedule allows.  Stacie Glaze, PT DPT Acute Rehabilitation Services Pager [phone]  Office [phone]    Louis Matte 05/29/2021, 12:23 PM

## 2021-05-29 NOTE — TOC Benefit Eligibility Note (Signed)
Patient Teacher, English as a foreign language completed.     The patient has no pharmacy coverage   Lyndel Safe, [place] Patient Advocate Specialist [facility] Antimicrobial Stewardship Team Direct Number: [phone]  Fax: [phone]

## 2021-05-29 NOTE — Discharge Instructions (Addendum)
PATIENT ASSISTANCE FOR ELIQUIS - do immediately upon discharge Alexander Robles: [phone] or go to [email or web address]  Information on my medicine - ELIQUIS (apixaban)  Why was Eliquis prescribed for you? Eliquis was prescribed to treat blood clots that may have been found in the veins of your legs (deep vein thrombosis) or in your lungs (pulmonary embolism) and to reduce the risk of them occurring again.  What do You need to know about Eliquis ? The starting dose is 10 mg (two 5 mg tablets) taken TWICE daily for the FIRST SEVEN (7) DAYS, then on  7/24 evening dose  the dose is reduced to ONE 5 mg tablet taken TWICE daily.  Eliquis may be taken with or without food.   Try to take the dose about the same time in the morning and in the evening. If you have difficulty swallowing the tablet whole please discuss with your pharmacist how to take the medication safely.  Take Eliquis exactly as prescribed and DO NOT stop taking Eliquis without talking to the doctor who prescribed the medication.  Stopping may increase your risk of developing a new blood clot.  Refill your prescription before you run out.  After discharge, you should have regular check-up appointments with your healthcare provider that is prescribing your Eliquis.    What do you do if you miss a dose? If a dose of ELIQUIS is not taken at the scheduled time, take it as soon as possible on the same day and twice-daily administration should be resumed. The dose should not be doubled to make up for a missed dose.  Important Safety Information A possible side effect of Eliquis is bleeding. You should call your healthcare provider right away if you experience any of the following: Bleeding from an injury or your nose that does not stop. Unusual colored urine (red or dark brown) or unusual colored stools (red or black). Unusual bruising for unknown reasons. A serious fall or if you hit your head (even if there is no  bleeding).  Some medicines may interact with Eliquis and might increase your risk of bleeding or clotting while on Eliquis. To help avoid this, consult your healthcare provider or pharmacist prior to using any new prescription or non-prescription medications, including herbals, vitamins, non-steroidal anti-inflammatory drugs (NSAIDs) and supplements.  This website has more information on Eliquis (apixaban): http://www.eliquis.com/eliquis/home

## 2021-05-29 NOTE — Progress Notes (Signed)
  Mobility Specialist Criteria Algorithm Info.  SATURATION QUALIFICATIONS: (This note is used to comply with regulatory documentation for home oxygen)  Patient Saturations on Room Air at Rest = 94%  Patient Saturations on Room Air while Ambulating = 83%  Patient Saturations on 3 Liters of oxygen while Ambulating = 92%  Please briefly explain why patient needs home oxygen:  Mobility Team:  [facility] elevated:Self regulated Activity: Ambulated in hall; Ambulated to bathroom; Transferred:  Bed to chair (to cahir after ambulation) Range of motion: All extremities; Active Level of assistance: Standby assist, set-up cues, supervision of patient - no hands on Assistive device: Front wheel walker Minutes sitting in chair:  Minutes stood: 5 minutes Minutes ambulated: 5 minutes Distance ambulated (ft): 240 ft Mobility response: Tolerated well Bed Position: Chair  Patient agreed to participate in mobility this morning. Completed education on importance of increased mobility/ambulation frequency, energy conservation and pursed lip breathing w/receptive feedback. Ambulated in hallway 240 feet with slow steady gait at supervision. Required cues to stand closer to RW. Patient with poor signal on oximeter at times but oxygen appeared to desaturate on room air to low 80's with good wavelength. Oxygen saturation >90% on 3LO2 while ambulating. Tolerated ambulation well without complaint or incident and was left in recliner chair with NT present.   05/29/2021 1:00 PM

## 2021-05-29 NOTE — Progress Notes (Signed)
Pt alert and oriented x4. Home O2 delivered at bedside. Medications delivered by discharge pharmacy. AVS printed and reviewed with patient at bedside, questions encouraged and answered appropriately. PIV removed per protocol. PT transported to main lobby for discharge home.

## 2021-06-02 ENCOUNTER — Other Ambulatory Visit (HOSPITAL_COMMUNITY): Payer: Self-pay

## 2021-06-02 ENCOUNTER — Telehealth (HOSPITAL_COMMUNITY): Payer: Self-pay

## 2021-06-02 NOTE — Telephone Encounter (Signed)
Transitions of Care Pharmacy  ° °Call attempted for a pharmacy transitions of care follow-up. HIPAA appropriate voicemail was left with call back information provided.  ° °Call attempt #1. Will follow-up in 2-3 days.  °  °

## 2021-06-07 ENCOUNTER — Telehealth (HOSPITAL_COMMUNITY): Payer: Self-pay

## 2021-06-07 ENCOUNTER — Telehealth (HOSPITAL_COMMUNITY): Payer: Self-pay | Admitting: Pharmacist

## 2021-06-07 ENCOUNTER — Other Ambulatory Visit (HOSPITAL_COMMUNITY): Payer: Self-pay

## 2021-06-07 NOTE — Telephone Encounter (Signed)
Pharmacy Transitions of Care Follow-up Telephone Call   Date of discharge: 05/29/21 Discharge Diagnosis: Acute Saddle PE   Date of discharge: 05/29/21 Discharge Diagnosis: Acute Saddle PE   How have you been since you were released from the hospital?  Good   Medication changes made at discharge: START taking: benzonatate (TESSALON)  Cepacol Extra Strength (Benzocaine-Menthol)  chlorpheniramine-HYDROcodone (TUSSIONEX)  Eliquis (apixaban)  Mucus Relief DM    Icon medications to stop taking  STOP taking: amLODipine 10 MG tablet (NORVASC)  doxazosin 8 MG tablet (CARDURA)  guaiFENesin 100 MG/5ML Soln (ROBITUSSIN)  Melatonin 10 MG Tabs    Medication changes verified by the patient? Yes     Medication Accessibility:   Home Pharmacy:  Wants to be transferred to [facility]   Was the patient provided with refills on discharged medications? yes   Have all prescriptions been transferred from [facility] to home pharmacy?  Yes   Is the patient able to afford medications? Patient is uninsured. At discharge patient was suggested to apply for orange card with health department.   Medication Review:   APIXABAN (ELIQUIS) Apixaban 10 mg BID initiated on 05/27/21. Will switch to apixaban 5 mg BID after 7 days (DATE 06/02/21). - Discussed importance of taking medication around the same time everyday - Advised patient of medications to avoid (NSAIDs, ASA) - Educated that Tylenol (acetaminophen) will be the preferred analgesic to prevent risk of bleeding - Emphasized importance of monitoring for signs and symptoms of bleeding (abnormal bruising, prolonged bleeding, nose bleeds, bleeding from gums, discolored urine, black tarry stools) - Advised patient to alert all providers of anticoagulation therapy prior to starting a new medication or having a procedure   Follow-up Appointments:   PCP Hospital f/u appt confirmed?   [facility] f/u appt confirmed? Yes Scheduled to see Dr. Gasper Sells on  08/11/21 @ Cardiology.   If their condition worsens, is the pt aware to call PCP or go to the Emergency Dept.? Yes   Final Patient Assessment  Doing well.  Working on coupon for [employer]

## 2021-06-07 NOTE — Telephone Encounter (Signed)
Transitions of Care Pharmacy   Call attempted for a pharmacy transitions of care follow-up. HIPAA appropriate voicemail was left with call back information provided.   Call attempt #2. Will follow-up in 2-3 days.    

## 2021-06-13 ENCOUNTER — Other Ambulatory Visit (HOSPITAL_COMMUNITY): Payer: Self-pay

## 2021-07-13 DIAGNOSIS — I82401 Acute embolism and thrombosis of unspecified deep veins of right lower extremity: Secondary | ICD-10-CM | POA: Diagnosis not present

## 2021-07-13 DIAGNOSIS — I2699 Other pulmonary embolism without acute cor pulmonale: Secondary | ICD-10-CM | POA: Diagnosis not present

## 2021-07-13 DIAGNOSIS — I5030 Unspecified diastolic (congestive) heart failure: Secondary | ICD-10-CM | POA: Diagnosis not present

## 2021-07-13 DIAGNOSIS — I10 Essential (primary) hypertension: Secondary | ICD-10-CM | POA: Diagnosis not present

## 2021-07-13 DIAGNOSIS — M549 Dorsalgia, unspecified: Secondary | ICD-10-CM | POA: Diagnosis not present

## 2021-08-02 ENCOUNTER — Other Ambulatory Visit (HOSPITAL_COMMUNITY): Payer: Self-pay

## 2021-08-04 ENCOUNTER — Other Ambulatory Visit (HOSPITAL_COMMUNITY): Payer: Self-pay

## 2021-08-04 DIAGNOSIS — I10 Essential (primary) hypertension: Secondary | ICD-10-CM | POA: Diagnosis not present

## 2021-08-04 DIAGNOSIS — Z23 Encounter for immunization: Secondary | ICD-10-CM | POA: Diagnosis not present

## 2021-08-04 DIAGNOSIS — I82401 Acute embolism and thrombosis of unspecified deep veins of right lower extremity: Secondary | ICD-10-CM | POA: Diagnosis not present

## 2021-08-04 DIAGNOSIS — I5030 Unspecified diastolic (congestive) heart failure: Secondary | ICD-10-CM | POA: Diagnosis not present

## 2021-08-04 DIAGNOSIS — I2699 Other pulmonary embolism without acute cor pulmonale: Secondary | ICD-10-CM | POA: Diagnosis not present

## 2021-08-04 MED ORDER — ELIQUIS 5 MG PO TABS
ORAL_TABLET | ORAL | 4 refills | Status: DC
  Filled 2021-08-04: qty 180, 90d supply, fill #0

## 2021-08-11 ENCOUNTER — Ambulatory Visit: Payer: Medicare Other | Admitting: Internal Medicine

## 2021-08-29 ENCOUNTER — Other Ambulatory Visit (HOSPITAL_COMMUNITY): Payer: Self-pay

## 2021-11-14 NOTE — Progress Notes (Signed)
Cardiology Office Note:    Date:  11/17/2021   ID:  Alexander Robles, DOB [birth year], MRN [id number]  PCP:  Wenda Low, MD   [facility] Providers Cardiologist:  Werner Lean, MD     Referring MD: Wenda Low, MD   CC: Hospital follow up  History of Present Illness:    Alexander Robles is a 71 y.o. 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  Patient notes that he is doing better since leaving the hospital.  Notes this knees are bad- had a long time working at a meat packing plan.Notes that he was previously drinking to much alcohol prior to PE and is now alcohol free.  Notes that he has a dietary plan for the new year and last year already lost some weight from prior.    Notes that he has hard heart beats.  That come and go randomly. No chest pain or pressure .  No SOB/DOE and no PND/Orthopnea.  No weight gain or leg swelling.  No palpitations or syncope . Feels like he is 38 again.Does light weight training.  Has a shake weight too.  No heavy lifting.  Ambulatory blood pressure not done.   Past Medical History:  Diagnosis Date   Carpal tunnel syndrome    Cubital tunnel syndrome on right    Hypertension    states under control with meds., has been on med. x 5 yr.   Skin disease, fungal    on back   Wears partial dentures    upper    Past Surgical History:  Procedure Laterality Date   CARPAL TUNNEL RELEASE Right 08/02/2017   Procedure: RIGHT CARPAL TUNNEL RELEASE;  Surgeon: Daryll Brod, MD;  Location: [place];  Service: Orthopedics;  Laterality: Right;  AX BLOCK   COLONOSCOPY     ULNAR NERVE TRANSPOSITION Right 08/02/2017   Procedure: RIGHT ULNAR NERVE DECOMPRESSION;  Surgeon: Daryll Brod, MD;  Location: [place];  Service: Orthopedics;  Laterality: Right;    Current Medications: Current Meds  Medication Sig    acetaminophen (TYLENOL) 500 MG tablet Take 500 mg by mouth every 6 (six) hours as needed for moderate pain or headache.   apixaban (ELIQUIS) 5 MG TABS tablet Take 1 tablet by mouth twice a day   Benzocaine-Menthol 15-2.6 MG LOZG Take 1 lozenge by mouth as needed.   benzonatate (TESSALON) 200 MG capsule Take 1 capsule (200 mg total) by mouth 3 (three) times daily as needed for cough.   chlorpheniramine-HYDROcodone (TUSSIONEX) 10-8 MG/5ML SUER Take 5 mLs by mouth every 12 (twelve) hours as needed for cough.   dextromethorphan-guaiFENesin (MUCINEX DM) 30-600 MG 12hr tablet Take 1 tablet by mouth 2 (two) times daily.   furosemide (LASIX) 40 MG tablet Take 40 mg by mouth every morning.   Multiple Vitamins-Minerals (CENTRUM SILVER 50+MEN) TABS Take 1 tablet by mouth daily.   Omega-3 Fatty Acids (FISH OIL) 1000 MG CPDR Take 1,000 mg by mouth daily.   potassium chloride (KLOR-CON M) 10 MEQ tablet Take 10 mEq by mouth daily.   Tetrahydrozoline HCl (VISINE OP) Place 1 drop into both eyes daily as needed (red itchy eyes).     Allergies:   Patient has no known allergies.   Social History   Socioeconomic History   Marital status: Divorced    Spouse name: Not on file   Number of  children: Not on file   Years of education: Not on file   Highest education level: Not on file  Occupational History   Not on file  Tobacco Use   Smoking status: Never   Smokeless tobacco: Never  Vaping Use   Vaping Use: Never used  Substance and Sexual Activity   Alcohol use: Not Currently    Comment: 7 drinks/week.  05/23/2021-states he stopped drinking 2 weeks ago   Drug use: No   Sexual activity: Not on file  Other Topics Concern   Not on file  Social History Narrative   Not on file   Social Determinants of Health   Financial Resource Strain: Not on file  Food Insecurity: Not on file  Transportation Needs: Not on file  Physical Activity: Not on file  Stress: Not on file  Social Connections: Not on file     Social: former meat packing working  Family History: History of coronary artery disease notable for no members. History of heart failure notable for no members. History of arrhythmia notable for no members. Family history of DM  ROS:   Please see the history of present illness.     All other systems reviewed and are negative.  EKGs/Labs/Other Studies Reviewed:    The following studies were reviewed today:  EKG:   05/14/21: SR 95 anterior infarct pattern  Transthoracic Echocardiogram: Date: Results: 1. Left ventricular ejection fraction, by estimation, is 60 to 65%. The  left ventricle has normal function. The left ventricle has no regional  wall motion abnormalities. There is moderate left ventricular hypertrophy.  Left ventricular diastolic  parameters are consistent with Grade I diastolic dysfunction (impaired  relaxation).   2. Right ventricular systolic function is moderately reduced. The right  ventricular size is mildly enlarged. Tricuspid regurgitation signal is  inadequate for assessing PA pressure. D-shaped interventricular septum  suggestive of RV pressure/volume  overload.   3. The mitral valve is normal in structure. No evidence of mitral valve  regurgitation. No evidence of mitral stenosis.   4. The aortic valve is tricuspid. Aortic valve regurgitation is trivial.  Mild aortic valve sclerosis is present, with no evidence of aortic valve  stenosis.   5. Aortic dilatation noted. There is mild dilatation of the ascending  aorta, measuring 41 mm.   6. The inferior vena cava is dilated in size with >50% respiratory  variability, suggesting right atrial pressure of 8 mmHg.   7. Technically difficult study, but there is evidence for RV strain on  echo as above.    CTPE: Date: 05/24/21 Results: IMPRESSION: Positive for acute PE with CT evidence of right heart strain consistent with at least submassive (intermediate risk) PE. The presence of right heart strain  has been associated with an increased risk of morbidity and mortality. Please refer to the "PE Focused" order set in EPIC.  Recent Labs: 05/23/2021: ALT 24 05/24/2021: B Natriuretic Peptide 568.7; Magnesium 2.1 05/29/2021: BUN 10; Creatinine, Ser 0.93; Hemoglobin 14.7; Platelets 469; Potassium 4.5; Sodium 136  Recent Lipid Panel    Component Value Date/Time   CHOL 142 05/26/2021 0551   TRIG 87 05/26/2021 0551   HDL 42 05/26/2021 0551   CHOLHDL 3.4 05/26/2021 0551   VLDL 17 05/26/2021 0551   LDLCALC 83 05/26/2021 0551    Physical Exam:    VS:  BP 139/88    Pulse 89    Ht 5\' 7"  (1.702 m)    Wt 105.2 kg    SpO2 100%  BMI 36.34 kg/m     Wt Readings from Last 3 Encounters:  11/17/21 105.2 kg  05/29/21 104.8 kg  08/02/17 109.3 kg     Gen: no distress, morbid obesity   Neck: No JVD,  Cardiac: No Rubs or Gallops, no Murmur, normal rate, +2 radial pulses, no RV heave Respiratory: Clear to auscultation bilaterally, normal effort, normal  respiratory rate GI: Soft, nontender, non-distended  MS: No  edema; all moves all extremities Integument: Skin feels warm Neuro:  At time of evaluation, alert and oriented to person/place/time/situation  Psych: Normal affect, patient feels better   ASSESSMENT:    1. History of pulmonary embolus (PE)   2. Aortic atherosclerosis ([place])   3. 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  Concerns regarding medicines are outlined above.  Orders Placed This Encounter  Procedures   Lipid panel   ECHOCARDIOGRAM COMPLETE   No orders of the defined types were placed in this encounter.   Patient Instructions  Medication Instructions:  Your physician recommends that you continue on your current medications as directed. Please refer to the Current Medication list given to you today.  *If you need a refill on your cardiac medications before your next appointment, please call your pharmacy*   Lab Work: IN 4-5 MONTHS: Fasting lipid panel (nothing to eat or drink 8-12 hours prior)  If you have labs (blood work) drawn today and your tests are completely normal, you will receive your results only by: [place] (if you have MyChart) OR A paper copy in the mail If you have any lab test that is abnormal or we need to change your treatment, we will call you to review the results.   Testing/Procedures: Your physician has requested that you have an echocardiogram. Echocardiography is a painless test that uses sound waves to create images of your heart. It provides your doctor with information about the size and shape of your heart and how well your hearts chambers and valves are working. This procedure takes approximately one hour. There are no restrictions for this procedure.    Follow-Up: At [facility], you and your health needs are our priority.  As part of our continuing mission to provide you with exceptional heart care, we have created designated Provider Care Teams.  These Care Teams include your primary Cardiologist (physician) and Advanced Practice Providers (APPs -  Physician Assistants and Nurse Practitioners) who all work together to provide you with the care you need, when you need it.  We recommend signing up for the patient portal called "MyChart".  Sign up  information is provided on this After Visit Summary.  MyChart is used to connect with patients for Virtual Visits (Telemedicine).  Patients are able to view lab/test results, encounter notes,  upcoming appointments, etc.  Non-urgent messages can be sent to your provider as well.   To learn more about what you can do with MyChart, go to [email or web address].    Your next appointment:   4 - 66month(s)  The format for your next appointment:   In Person  Provider:   Werner Lean, MD      Other Instructions Please check your Blood Pressure as discussed with Dr. Gasper Sells    Signed, Werner Lean, MD  11/17/2021 12:10 PM    [place]

## 2021-11-17 ENCOUNTER — Other Ambulatory Visit: Payer: Self-pay

## 2021-11-17 ENCOUNTER — Encounter: Payer: Self-pay | Admitting: Internal Medicine

## 2021-11-17 ENCOUNTER — Ambulatory Visit (INDEPENDENT_AMBULATORY_CARE_PROVIDER_SITE_OTHER): Payer: Medicare Other | Admitting: Internal Medicine

## 2021-11-17 VITALS — BP 139/88 | HR 89 | Ht 67.0 in | Wt 232.0 lb

## 2021-11-17 DIAGNOSIS — I10 Essential (primary) hypertension: Secondary | ICD-10-CM

## 2021-11-17 DIAGNOSIS — I700 Atherosclerosis of aorta: Secondary | ICD-10-CM | POA: Diagnosis not present

## 2021-11-17 DIAGNOSIS — Z86711 Personal history of pulmonary embolism: Secondary | ICD-10-CM | POA: Diagnosis not present

## 2021-11-17 NOTE — Patient Instructions (Signed)
Medication Instructions:  Your physician recommends that you continue on your current medications as directed. Please refer to the Current Medication list given to you today.  *If you need a refill on your cardiac medications before your next appointment, please call your pharmacy*   Lab Work: IN 4-5 MONTHS: Fasting lipid panel (nothing to eat or drink 8-12 hours prior)  If you have labs (blood work) drawn today and your tests are completely normal, you will receive your results only by: [place] (if you have MyChart) OR A paper copy in the mail If you have any lab test that is abnormal or we need to change your treatment, we will call you to review the results.   Testing/Procedures: Your physician has requested that you have an echocardiogram. Echocardiography is a painless test that uses sound waves to create images of your heart. It provides your doctor with information about the size and shape of your heart and how well your hearts chambers and valves are working. This procedure takes approximately one hour. There are no restrictions for this procedure.    Follow-Up: At [facility], you and your health needs are our priority.  As part of our continuing mission to provide you with exceptional heart care, we have created designated Provider Care Teams.  These Care Teams include your primary Cardiologist (physician) and Advanced Practice Providers (APPs -  Physician Assistants and Nurse Practitioners) who all work together to provide you with the care you need, when you need it.  We recommend signing up for the patient portal called "MyChart".  Sign up information is provided on this After Visit Summary.  MyChart is used to connect with patients for Virtual Visits (Telemedicine).  Patients are able to view lab/test results, encounter notes, upcoming appointments, etc.  Non-urgent messages can be sent to your provider as well.   To learn more about what you can do with MyChart, go  to [email or web address].    Your next appointment:   4 - 32month(s)  The format for your next appointment:   In Person  Provider:   Werner Lean, MD      Other Instructions Please check your Blood Pressure as discussed with Dr. Gasper Sells

## 2021-12-01 ENCOUNTER — Other Ambulatory Visit: Payer: Self-pay

## 2021-12-01 ENCOUNTER — Ambulatory Visit (HOSPITAL_COMMUNITY): Payer: Medicare Other | Attending: Cardiovascular Disease

## 2021-12-01 DIAGNOSIS — Z86711 Personal history of pulmonary embolism: Secondary | ICD-10-CM

## 2021-12-01 DIAGNOSIS — I700 Atherosclerosis of aorta: Secondary | ICD-10-CM | POA: Diagnosis not present

## 2021-12-01 DIAGNOSIS — I10 Essential (primary) hypertension: Secondary | ICD-10-CM

## 2021-12-01 LAB — ECHOCARDIOGRAM COMPLETE
Area-P 1/2: 3.42 cm2
S' Lateral: 2.4 cm

## 2021-12-01 MED ORDER — PERFLUTREN LIPID MICROSPHERE
1.0000 mL | INTRAVENOUS | Status: AC | PRN
  Administered 2021-12-01: 3 mL via INTRAVENOUS
  Administered 2021-12-01: 1 mL via INTRAVENOUS

## 2021-12-08 DIAGNOSIS — R21 Rash and other nonspecific skin eruption: Secondary | ICD-10-CM | POA: Diagnosis not present

## 2021-12-08 DIAGNOSIS — I5030 Unspecified diastolic (congestive) heart failure: Secondary | ICD-10-CM | POA: Diagnosis not present

## 2021-12-08 DIAGNOSIS — M519 Unspecified thoracic, thoracolumbar and lumbosacral intervertebral disc disorder: Secondary | ICD-10-CM | POA: Diagnosis not present

## 2021-12-08 DIAGNOSIS — I82401 Acute embolism and thrombosis of unspecified deep veins of right lower extremity: Secondary | ICD-10-CM | POA: Diagnosis not present

## 2021-12-08 DIAGNOSIS — N529 Male erectile dysfunction, unspecified: Secondary | ICD-10-CM | POA: Diagnosis not present

## 2021-12-08 DIAGNOSIS — R7303 Prediabetes: Secondary | ICD-10-CM | POA: Diagnosis not present

## 2021-12-08 DIAGNOSIS — I2699 Other pulmonary embolism without acute cor pulmonale: Secondary | ICD-10-CM | POA: Diagnosis not present

## 2021-12-08 DIAGNOSIS — M179 Osteoarthritis of knee, unspecified: Secondary | ICD-10-CM | POA: Diagnosis not present

## 2021-12-08 DIAGNOSIS — E782 Mixed hyperlipidemia: Secondary | ICD-10-CM | POA: Diagnosis not present

## 2021-12-08 DIAGNOSIS — I10 Essential (primary) hypertension: Secondary | ICD-10-CM | POA: Diagnosis not present

## 2022-03-30 ENCOUNTER — Other Ambulatory Visit: Payer: Medicare Other | Admitting: *Deleted

## 2022-03-30 DIAGNOSIS — I10 Essential (primary) hypertension: Secondary | ICD-10-CM

## 2022-03-30 DIAGNOSIS — Z86711 Personal history of pulmonary embolism: Secondary | ICD-10-CM

## 2022-03-30 DIAGNOSIS — I700 Atherosclerosis of aorta: Secondary | ICD-10-CM

## 2022-03-30 LAB — LIPID PANEL
Chol/HDL Ratio: 2.6 ratio (ref 0.0–5.0)
Cholesterol, Total: 166 mg/dL (ref 100–199)
HDL: 65 mg/dL (ref >39)
LDL Chol Calc (NIH): 89 mg/dL (ref 0–99)
Triglycerides: 60 mg/dL (ref 0–149)
VLDL Cholesterol Cal: 12 mg/dL (ref 5–40)

## 2022-04-02 ENCOUNTER — Telehealth: Payer: Self-pay

## 2022-04-02 DIAGNOSIS — I700 Atherosclerosis of aorta: Secondary | ICD-10-CM

## 2022-04-02 MED ORDER — ROSUVASTATIN CALCIUM 5 MG PO TABS: 5.0000 mg | ORAL_TABLET | Freq: Every evening | ORAL | 3 refills | Status: DC

## 2022-04-02 NOTE — Telephone Encounter (Signed)
The patient has been notified of the result and verbalized understanding.  All questions (if any) were answered. Precious Gilding, RN 04/02/2022 11:35 AM   F/U lab appointment scheduled for 06/29/22.  Advised pt to fast prior also have sent out an appointment reminder with instructions.

## 2022-04-02 NOTE — Telephone Encounter (Signed)
-----   Message from Werner Lean, MD sent at 04/01/2022  2:26 PM EDT ----- Results: LDL still above goal Plan: Offer rosuvastatin 5 and labs in three months  Werner Lean, MD

## 2022-04-04 ENCOUNTER — Other Ambulatory Visit (HOSPITAL_COMMUNITY): Payer: Self-pay

## 2022-04-06 ENCOUNTER — Ambulatory Visit (INDEPENDENT_AMBULATORY_CARE_PROVIDER_SITE_OTHER): Payer: Medicare Other | Admitting: Internal Medicine

## 2022-04-06 ENCOUNTER — Encounter: Payer: Self-pay | Admitting: Internal Medicine

## 2022-04-06 VITALS — BP 178/90 | HR 68 | Ht 67.0 in | Wt 238.4 lb

## 2022-04-06 DIAGNOSIS — I10 Essential (primary) hypertension: Secondary | ICD-10-CM | POA: Diagnosis not present

## 2022-04-06 DIAGNOSIS — I712 Thoracic aortic aneurysm, without rupture, unspecified: Secondary | ICD-10-CM

## 2022-04-06 DIAGNOSIS — I351 Nonrheumatic aortic (valve) insufficiency: Secondary | ICD-10-CM | POA: Diagnosis not present

## 2022-04-06 MED ORDER — LISINOPRIL 5 MG PO TABS
5.0000 mg | ORAL_TABLET | Freq: Every day | ORAL | 3 refills | Status: DC
Start: 2022-04-06 — End: 2022-12-24

## 2022-04-06 NOTE — Patient Instructions (Signed)
Medication Instructions:  Your physician has recommended you make the following change in your medication:  START: lisinopril 5 mg by mouth once daily  *If you need a refill on your cardiac medications before your next appointment, please call your pharmacy*   Lab Work: IN 1-2 WEEKS BMP  If you have labs (blood work) drawn today and your tests are completely normal, you will receive your results only by: [place] (if you have MyChart) OR A paper copy in the mail If you have any lab test that is abnormal or we need to change your treatment, we will call you to review the results.   Testing/Procedures: JAN 2024- -  Your physician has requested that you have an echocardiogram. Echocardiography is a painless test that uses sound waves to create images of your heart. It provides your doctor with information about the size and shape of your heart and how well your heart's chambers and valves are working. This procedure takes approximately one hour. There are no restrictions for this procedure.  IN 1-2 WEEKS Nurse visit for Blood Pressure check.  Please bring your home blood pressure cuff to appointment.    Follow-Up: At [facility], you and your health needs are our priority.  As part of our continuing mission to provide you with exceptional heart care, we have created designated Provider Care Teams.  These Care Teams include your primary Cardiologist (physician) and Advanced Practice Providers (APPs -  Physician Assistants and Nurse Practitioners) who all work together to provide you with the care you need, when you need it.  We recommend signing up for the patient portal called "MyChart".  Sign up information is provided on this After Visit Summary.  MyChart is used to connect with patients for Virtual Visits (Telemedicine).  Patients are able to view lab/test results, encounter notes, upcoming appointments, etc.  Non-urgent messages can be sent to your provider as well.   To learn  more about what you can do with MyChart, go to [email or web address].    Your next appointment:   3-4 month(s)  The format for your next appointment:   In Person  Provider:   Melina Copa, PA-C, Ermalinda Barrios, PA-C, or Christen Bame, NP     Then, Werner Lean, MD will plan to see you again in 8 month(s).    Other Instructions Please monitor your blood pressure as discussed with Dr. Gasper Sells.  HOW TO TAKE YOUR BLOOD PRESSURE: Rest 5 minutes before taking your blood pressure.  Don't smoke or drink caffeinated beverages for at least 30 minutes before. Take your blood pressure before (not after) you eat. Sit comfortably with your back supported and both feet on the floor (don't cross your legs). Elevate your arm to heart level on a table or a desk. Use the proper sized cuff. It should fit smoothly and snugly around your bare upper arm. There should be enough room to slip a fingertip under the cuff. The bottom edge of the cuff should be 1 inch above the crease of the elbow   Important Information About Sugar

## 2022-04-06 NOTE — Progress Notes (Signed)
Cardiology Office Note:    Date:  04/06/2022   ID:  Alexander Robles, DOB 07-10-1951, MRN [id number]  PCP:  Wenda Low, MD   [facility] Providers Cardiologist:  Werner Lean, MD     Referring MD: Wenda Low, MD   CC: Post echo f/u  History of Present Illness:    Alexander Robles is a 71 y.o. 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  LDL was slightly above goal in 03/30/22 labs.  Patient notes that he is doing good.   Since last visit notes that his arthritis is bothering him and this and decrease his activity. He has gained some weight and ate poorly last night (hot dogs and meat) and is disappointed his BP is high. Has been drinking a lot sweet tea. There are no interval hospital/ED visit.    No chest pain or pressure .  No SOB/DOE and no PND/Orthopnea.  No leg swelling.  No palpitations or syncope .  Ambulatory blood pressure has been spotty because of lack of cuff, he now has a new BP cuff.    Past Medical History:  Diagnosis Date   Carpal tunnel syndrome    Cubital tunnel syndrome on right    Hypertension    states under control with meds., has been on med. x 5 yr.   Skin disease, fungal    on back   Wears partial dentures    upper    Past Surgical History:  Procedure Laterality Date   CARPAL TUNNEL RELEASE Right 08/02/2017   Procedure: RIGHT CARPAL TUNNEL RELEASE;  Surgeon: Daryll Brod, MD;  Location: [place];  Service: Orthopedics;  Laterality: Right;  AX BLOCK   COLONOSCOPY     ULNAR NERVE TRANSPOSITION Right 08/02/2017   Procedure: RIGHT ULNAR NERVE DECOMPRESSION;  Surgeon: Daryll Brod, MD;  Location: [place];  Service: Orthopedics;  Laterality: Right;    Current Medications: Current Meds  Medication Sig   acetaminophen (TYLENOL) 500 MG tablet Take 500 mg by mouth every 6 (six) hours as needed  for moderate pain or headache.   apixaban (ELIQUIS) 5 MG TABS tablet Take 1 tablet by mouth twice a day   Benzocaine-Menthol 15-2.6 MG LOZG Take 1 lozenge by mouth as needed.   furosemide (LASIX) 40 MG tablet Take 40 mg by mouth every morning.   lisinopril (ZESTRIL) 5 MG tablet Take 1 tablet (5 mg total) by mouth daily.   Multiple Vitamins-Minerals (CENTRUM SILVER 50+MEN) TABS Take 1 tablet by mouth daily.   Omega-3 Fatty Acids (FISH OIL) 1000 MG CPDR Take 1,000 mg by mouth daily.   potassium chloride (KLOR-CON M) 10 MEQ tablet Take 10 mEq by mouth daily.   rosuvastatin (CRESTOR) 5 MG tablet Take 1 tablet (5 mg total) by mouth every evening.     Allergies:   Patient has no known allergies.   Social History   Socioeconomic History   Marital status: Divorced    Spouse name: Not on file   Number of children: Not on file   Years of education: Not on file   Highest education level: Not on file  Occupational History   Not on file  Tobacco Use   Smoking status: Never   Smokeless tobacco: Never  Vaping Use   Vaping Use: Never used  Substance and Sexual Activity   Alcohol use: Not Currently  Comment: 7 drinks/week.  05/23/2021-states he stopped drinking 2 weeks ago   Drug use: No   Sexual activity: Not on file  Other Topics Concern   Not on file  Social History Narrative   Not on file   Social Determinants of Health   Financial Resource Strain: Not on file  Food Insecurity: Not on file  Transportation Needs: Not on file  Physical Activity: Not on file  Stress: Not on file  Social Connections: Not on file    Social: former meat packing working  Family History: History of coronary artery disease notable for no members. History of heart failure notable for no members. History of arrhythmia notable for no members. Family history of DM  ROS:   Please see the history of present illness.     All other systems reviewed and are negative.  EKGs/Labs/Other Studies Reviewed:     The following studies were reviewed today:  EKG:   05/14/21: SR 95 anterior infarct pattern  Transthoracic Echocardiogram: Date:1 2023 Results: 1. Left ventricular ejection fraction, by estimation, is 60 to 65%. The  left ventricle has normal function. The left ventricle has no regional  wall motion abnormalities. There is moderate left ventricular hypertrophy.  Left ventricular diastolic  parameters are consistent with Grade I diastolic dysfunction (impaired  relaxation).   2. Right ventricular systolic function is moderately reduced. The right  ventricular size is mildly enlarged. Tricuspid regurgitation signal is  inadequate for assessing PA pressure. D-shaped interventricular septum  suggestive of RV pressure/volume  overload.   3. The mitral valve is normal in structure. No evidence of mitral valve  regurgitation. No evidence of mitral stenosis.   4. The aortic valve is tricuspid. Aortic valve regurgitation is trivial.  Mild aortic valve sclerosis is present, with no evidence of aortic valve  stenosis.   5. Aortic dilatation noted. There is mild dilatation of the ascending  aorta, measuring 41 mm.   6. The inferior vena cava is dilated in size with >50% respiratory  variability, suggesting right atrial pressure of 8 mmHg.   7. Technically difficult study, but there is evidence for RV strain on  echo as above.    CTPE: Date: 05/24/21 Results: IMPRESSION: Positive for acute PE with CT evidence of right heart strain consistent with at least submassive (intermediate risk) PE. The presence of right heart strain has been associated with an increased risk of morbidity and mortality. Please refer to the "PE Focused" order set in EPIC.  Recent Labs: 05/23/2021: ALT 24 05/24/2021: B Natriuretic Peptide 568.7; Magnesium 2.1 05/29/2021: BUN 10; Creatinine, Ser 0.93; Hemoglobin 14.7; Platelets 469; Potassium 4.5; Sodium 136  Recent Lipid Panel    Component Value Date/Time    CHOL 166 03/30/2022 0830   TRIG 60 03/30/2022 0830   HDL 65 03/30/2022 0830   CHOLHDL 2.6 03/30/2022 0830   CHOLHDL 3.4 05/26/2021 0551   VLDL 17 05/26/2021 0551   LDLCALC 89 03/30/2022 0830    Physical Exam:    VS:  BP (!) 178/90   Pulse 68   Ht '5\' 7"'$  (1.702 m)   Wt 238 lb 6.4 oz (108.1 kg)   SpO2 94%   BMI 37.34 kg/m     Wt Readings from Last 3 Encounters:  04/06/22 238 lb 6.4 oz (108.1 kg)  11/17/21 232 lb (105.2 kg)  05/29/21 231 lb (104.8 kg)     Gen: no distress, morbid obesity   Neck: No JVD,  Cardiac: No Rubs or Gallops, no Murmur,  normal rate, +2 radial pulses Respiratory: Clear to auscultation bilaterally, normal effort, normal  respiratory rate GI: Soft, nontender, non-distended  MS: No edema; all moves all extremities, walks  Integument: Skin feels warm Neuro:  At time of evaluation, alert and oriented to person/place/time/situation  Psych: Normal affect, patient feels better   ASSESSMENT:    1. Nonrheumatic aortic valve insufficiency   2. Thoracic aortic aneurysm without rupture, unspecified part ([place])   3. 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  Concerns regarding medicines are outlined  above.  Orders Placed This Encounter  Procedures   Basic metabolic panel   ECHOCARDIOGRAM COMPLETE   Meds ordered this encounter  Medications   lisinopril (ZESTRIL) 5 MG tablet    Sig: Take 1 tablet (5 mg total) by mouth daily.    Dispense:  90 tablet    Refill:  3    Patient Instructions  Medication Instructions:  Your physician has recommended you make the following change in your medication:  START: lisinopril 5 mg by mouth once daily  *If you need a refill on your cardiac medications before your next appointment, please call your pharmacy*   Lab Work: IN 1-2 WEEKS BMP  If you have labs (blood work) drawn today and your tests are completely normal, you will receive your results only by: [place] (if you have MyChart) OR A paper copy in the mail If you have any lab test that is abnormal or we need to change your treatment, we will call you to review the results.   Testing/Procedures: JAN 2024- -  Your physician has requested that you have an echocardiogram. Echocardiography is a painless test that uses sound waves to create images of your heart. It provides your doctor with information about the size and shape of your heart and how well your heart's chambers and valves are working. This procedure takes approximately one hour. There are no restrictions for this procedure.  IN 1-2 WEEKS Nurse visit for Blood Pressure check.  Please bring your home blood pressure cuff to appointment.    Follow-Up: At [facility], you and your health needs are our priority.  As part of our continuing mission to provide you with exceptional heart care, we have created designated Provider Care Teams.  These Care Teams include your primary Cardiologist (physician) and Advanced Practice Providers (APPs -  Physician Assistants and Nurse Practitioners) who all work together to provide you with the care you need, when you need it.  We recommend signing up for the patient portal called  "MyChart".  Sign up information is provided on this After Visit Summary.  MyChart is  used to connect with patients for Virtual Visits (Telemedicine).  Patients are able to view lab/test results, encounter notes, upcoming appointments, etc.  Non-urgent messages can be sent to your provider as well.   To learn more about what you can do with MyChart, go to [email or web address].    Your next appointment:   3-4 month(s)  The format for your next appointment:   In Person  Provider:   Melina Copa, PA-C, Ermalinda Barrios, PA-C, or Christen Bame, NP     Then, Werner Lean, MD will plan to see you again in 8 month(s).    Other Instructions Please monitor your blood pressure as discussed with Dr. Gasper Sells.  HOW TO TAKE YOUR BLOOD PRESSURE: Rest 5 minutes before taking your blood pressure.  Don't smoke or drink caffeinated beverages for at least 30 minutes before. Take your blood pressure before (not after) you eat. Sit comfortably with your back supported and both feet on the floor (don't cross your legs). Elevate your arm to heart level on a table or a desk. Use the proper sized cuff. It should fit smoothly and snugly around your bare upper arm. There should be enough room to slip a fingertip under the cuff. The bottom edge of the cuff should be 1 inch above the crease of the elbow   Important Information About Sugar              Signed, Werner Lean, MD  04/06/2022 10:52 AM    [place]

## 2022-04-13 ENCOUNTER — Ambulatory Visit: Payer: Medicare Other

## 2022-04-13 ENCOUNTER — Other Ambulatory Visit: Payer: Medicare Other

## 2022-04-19 ENCOUNTER — Other Ambulatory Visit: Payer: Medicare Other

## 2022-04-19 ENCOUNTER — Ambulatory Visit (INDEPENDENT_AMBULATORY_CARE_PROVIDER_SITE_OTHER): Payer: Medicare Other

## 2022-04-19 VITALS — BP 128/74 | HR 72 | Ht 67.0 in | Wt 232.0 lb

## 2022-04-19 DIAGNOSIS — I10 Essential (primary) hypertension: Secondary | ICD-10-CM

## 2022-04-19 LAB — BASIC METABOLIC PANEL
BUN/Creatinine Ratio: 7 — ABNORMAL LOW (ref 10–24)
BUN: 6 mg/dL — ABNORMAL LOW (ref 8–27)
CO2: 28 mmol/L (ref 20–29)
Calcium: 9.3 mg/dL (ref 8.6–10.2)
Chloride: 99 mmol/L (ref 96–106)
Creatinine, Ser: 0.84 mg/dL (ref 0.76–1.27)
Glucose: 100 mg/dL — ABNORMAL HIGH (ref 70–99)
Potassium: 3.7 mmol/L (ref 3.5–5.2)
Sodium: 140 mmol/L (ref 134–144)
eGFR: 93 mL/min/{1.73_m2} (ref >59)

## 2022-04-19 NOTE — Progress Notes (Signed)
   Nurse Visit   Date of Encounter: 04/19/2022 ID: Alexander Robles, DOB 03/27/1951, MRN [id number]  PCP:  Wenda Low, MD   [facility] Providers Cardiologist:  Werner Lean, MD {    Visit Details   VS:  BP 128/74   Pulse 72   Ht '5\' 7"'$  (1.702 m)   Wt 232 lb (105.2 kg)   SpO2 96%   BMI 36.34 kg/m  , BMI Body mass index is 36.34 kg/m.  Wt Readings from Last 3 Encounters:  04/19/22 232 lb (105.2 kg)  04/06/22 238 lb 6.4 oz (108.1 kg)  11/17/21 232 lb (105.2 kg)     Reason for visit: BP check for HTN Performed today: Vitals, Provider consulted:Dr. Gasper Sells, and Education Changes (medications, testing, etc.) : No changes Length of Visit: 25 minutes  Patient started lisinopril 5 mg QD on 5/26. Patient's BP WNL today. BP checked on patient's home BP monitor showing 133/80. Patient also brought home BP log with readings 114-124/70s. Patient denies having any Sx and is getting BMET checked today. Patient has appointment in August with Christen Bame, NP. Patient educated on minimizing salt in his diet. Instructed patient to continue with current POC.    Signed, Cleon Gustin, RN  04/19/2022 11:42 AM

## 2022-05-04 ENCOUNTER — Telehealth: Payer: Self-pay

## 2022-05-04 DIAGNOSIS — Z86711 Personal history of pulmonary embolism: Secondary | ICD-10-CM

## 2022-05-04 NOTE — Telephone Encounter (Signed)
Called pt to review lab results.  Report only has  1 week of Eliquis left.  Wants to know if we could provide with samples.  Pt reports applied for pt assistance through PCP office.  Was told needs to spend $400 to qualify for assistance.  Advised pt would send message to pt assistance coordinator for input.

## 2022-05-07 MED ORDER — WARFARIN SODIUM 5 MG PO TABS
5.0000 mg | ORAL_TABLET | Freq: Every day | ORAL | 0 refills | Status: AC
Start: 2022-05-07 — End: ?

## 2022-05-07 NOTE — Telephone Encounter (Signed)
Discussed with Pharmacist. Per Margaretmary Dys, PharmD, pt should start on Warfarin 5mg  daily.  Called pt and provided education on Warfarin, the importance of compliance and coming to anticoagulation clinic for INR. Appt scheduled for 05/14/22 at 10:30 for "New Coumadin" .   Pt stated he ran out of Eliquis and has not taken any anticoagulant since. Prescription sent to requested pharmacy for Warfarin 5mg  daily and instructed pt to start taking tonight. Pt verbalized understanding.

## 2022-05-14 ENCOUNTER — Telehealth: Payer: Self-pay | Admitting: *Deleted

## 2022-05-14 NOTE — Telephone Encounter (Signed)
Called pt since he missed his Anticoagulation Appt at 1030am; advised that he missed the appt and he apologized. Advised we need to reschedule the appt he missed. He stated he didn't know he had an appt and that he hasn't taken any warfarin. He stated he found some eliquis at home and he has some eliquis samples that he is going to continue using. Asked how much he has an he stated he is not sure. Advised that we will need to know since he will need to switch/overlap over to the warfarin. He states he will have Dr. Deforest Hoyles to handle this. Educated him on moitoring once he starts warfarin and the risk of stroke, blood clots, another pe, and bleeding risks if he does start warfarin without being monitored. He state he would be okay until his appt he is going to make with Dr. Deforest Hoyles. Advised that Dr. Gasper Sells sent in the warfarin and before running completely out of eliquis and starting warfarin he should get an appt with a provider and he stated he would. Advised we want him to update either one of his providers to avoid the risks that I previously discussed and he stated he would call Dr. Deforest Hoyles.  Canceled today's appt since pt is not willing to reschedule and has not started warfarin.

## 2022-05-25 IMAGING — DX DG CHEST 1V PORT
1 series · 1 of 1 positions shown · non-contrast
Comparison: Chest radiographs 05/23/2021 and earlier.

CLINICAL DATA: 69-year-old male with shortness of breath.

EXAM:
PORTABLE CHEST 1 VIEW

[chest]
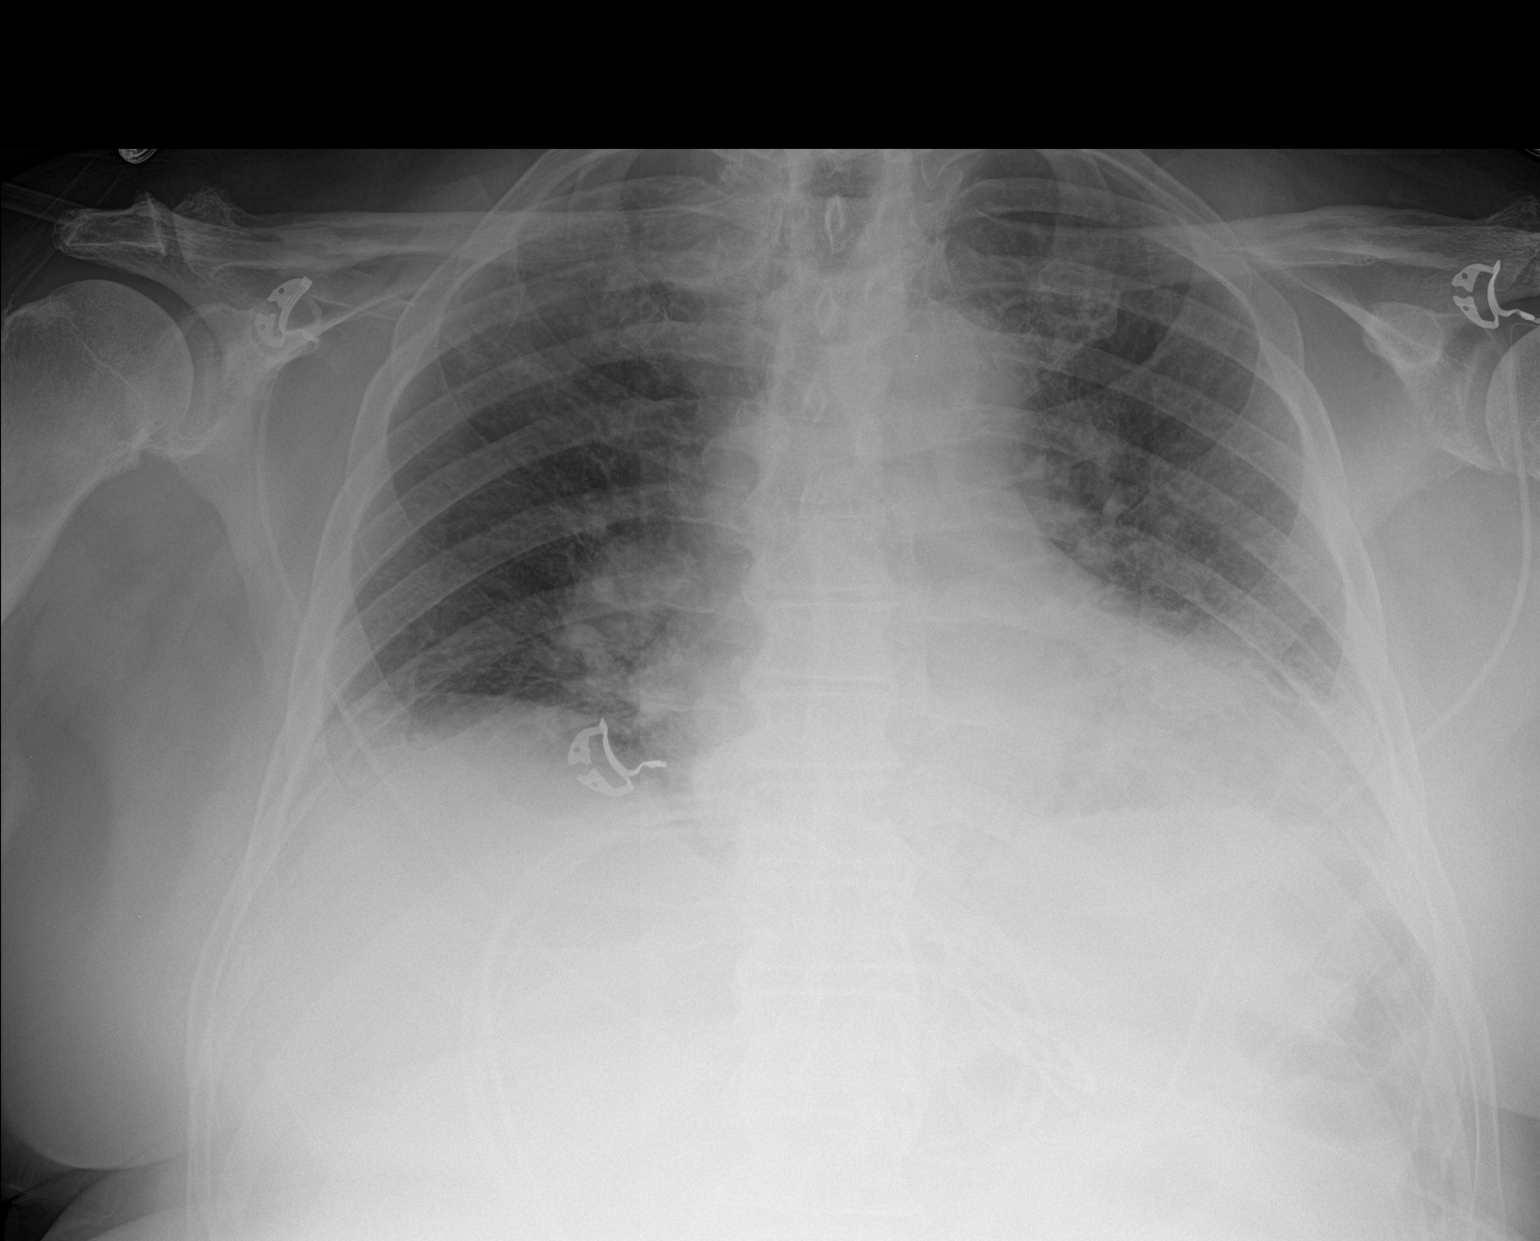

[1 of 1 positions shown; findings below may reference images not displayed]

FINDINGS: Portable AP semi upright view at 4738 hours. Continued low lung
volumes. Stable cardiac size and mediastinal contours. Questionable
cardiomegaly. No pneumothorax or consolidation. Mildly increased
interstitial markings in both lungs. Mild patchy opacity at the left
lung base. No definite effusion. Negative visible bowel gas. No
acute osseous abnormality identified.
IMPRESSION: Continued low lung volumes. Increased interstitial markings and
patchy left lung base opacity might simply reflect atelectasis, but
mild or developing interstitial edema or infection are difficult to
exclude.

## 2022-06-29 ENCOUNTER — Other Ambulatory Visit: Payer: Medicare Other

## 2022-07-04 ENCOUNTER — Ambulatory Visit: Payer: Self-pay

## 2022-07-06 ENCOUNTER — Telehealth: Payer: Self-pay | Admitting: Internal Medicine

## 2022-07-06 DIAGNOSIS — E782 Mixed hyperlipidemia: Secondary | ICD-10-CM | POA: Diagnosis not present

## 2022-07-06 DIAGNOSIS — Z125 Encounter for screening for malignant neoplasm of prostate: Secondary | ICD-10-CM | POA: Diagnosis not present

## 2022-07-06 DIAGNOSIS — M519 Unspecified thoracic, thoracolumbar and lumbosacral intervertebral disc disorder: Secondary | ICD-10-CM | POA: Diagnosis not present

## 2022-07-06 DIAGNOSIS — J309 Allergic rhinitis, unspecified: Secondary | ICD-10-CM | POA: Diagnosis not present

## 2022-07-06 DIAGNOSIS — B360 Pityriasis versicolor: Secondary | ICD-10-CM | POA: Diagnosis not present

## 2022-07-06 DIAGNOSIS — Z0000 Encounter for general adult medical examination without abnormal findings: Secondary | ICD-10-CM | POA: Diagnosis not present

## 2022-07-06 DIAGNOSIS — I5030 Unspecified diastolic (congestive) heart failure: Secondary | ICD-10-CM | POA: Diagnosis not present

## 2022-07-06 DIAGNOSIS — I10 Essential (primary) hypertension: Secondary | ICD-10-CM | POA: Diagnosis not present

## 2022-07-06 DIAGNOSIS — R7303 Prediabetes: Secondary | ICD-10-CM | POA: Diagnosis not present

## 2022-07-06 DIAGNOSIS — Z86718 Personal history of other venous thrombosis and embolism: Secondary | ICD-10-CM | POA: Diagnosis not present

## 2022-07-06 DIAGNOSIS — Z86711 Personal history of pulmonary embolism: Secondary | ICD-10-CM | POA: Diagnosis not present

## 2022-07-06 LAB — PROTIME-INR: INR: 1.3 — AB (ref 0.80–1.20)

## 2022-07-06 NOTE — Telephone Encounter (Signed)
Left a message on PCP voicemail requesting f/u as to if their office is managing anticoagulation and if so what medication pt is currently taking.

## 2022-07-06 NOTE — Telephone Encounter (Signed)
Aracely, RMA with PCP office called.  Expressed that pt told their office Cardiology is managing warfarin.  And that he has an appointment on Tuesday.  Advised that pt canceled coumadin clinic app't and expressed that PCP would manage medication. PCP advised pt to take warfarin 15 mg PO today X1 and then take 5 mg daily.  PCP is under the impression pt will f/u with our office on 8/29.  Pt has an OV with [place], NP 07/10/22 at 10:30 am.

## 2022-07-06 NOTE — Telephone Encounter (Signed)
Called pt to follow to see if he started taking warfarin or not and there was no answer. If pt has started warfarin he would need a NEW PT APPT with the Anticoagulation Clinic next week.   Per the last conversation when I spoke with the pt on 05/14/2022 (please see note) the pt stated he was not taking warfarin and was using all the eliquis he had at home. Pt was supposed to call back regarding starting warfarin or if he was going to continue eliquis per his PCP.

## 2022-07-06 NOTE — Telephone Encounter (Signed)
Calling to make provider aware that pt was seen in their office today. Pt's INR was 1.3. They are faxing over pt's labs. Please advise

## 2022-07-07 ENCOUNTER — Other Ambulatory Visit: Payer: Self-pay | Admitting: Internal Medicine

## 2022-07-07 DIAGNOSIS — Z86711 Personal history of pulmonary embolism: Secondary | ICD-10-CM

## 2022-07-07 NOTE — Progress Notes (Unsigned)
Cardiology Office Note:    Date:  07/10/2022   ID:  Alexander Robles, DOB 07/18/1951, MRN [id number]  PCP:  Alexander Low, MD   [facility] Providers Cardiologist:  Alexander Lean, MD     Referring MD: Alexander Low, MD   Chief Complaint: follow-up   History of Present Illness:    Alexander Robles is a very pleasant 71 y.o. 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 Found to have submassive pulmonary embolism with cor pulmonale.  BNP elevation 2/2 PE, troponins negative for ischemia. Reported heavy alcohol use at the time.   Return of normal RV function on TTE 12/01/21.  Moderate calcification of the aortic valve, aortic valve regurgitation mild, no evidence of aortic stenosis.  Mild dilatation of the ascending aorta measuring 40 mm.  Normal LVEF 60 to 65%, mild LVH, normal diastolic parameters.   Was last seen in our office on 04/06/22 Dr. Glenford Robles at which time he admitted to dietary indiscretion including large amounts of sweet tea and Gatorade.  BP was elevated, no SOB/DOE. Lisinopril was restarted and return in 6 months for follow-up. Echo is planned for 11/2022. He was advised to start rosuvastatin 5 mg daily following elevated LDL 03/2022.  Today, he is here alone for follow-up. Reports he is feeling well today. Is trying to increase exercise, [facility] At home, is using pedal exerciser and dumbbells. Tells me Eliquis was too expensive, there are numerous telephone encounters regarding his management of coumadin but he would like to eventually get back on Eliquis due to the hassle of getting INR checked. Does not drive long distance, gets confused on occasion. Occasional funny feeling in his chest  that resolves quickly and is not burdensome. Not associated with additional symptoms. He denies shortness of breath, lower extremity edema, fatigue, palpitations, melena, hematuria, hemoptysis, diaphoresis, weakness, presyncope, syncope, orthopnea, and PND.  Past Medical History:  Diagnosis Date   Carpal tunnel syndrome    Cubital tunnel syndrome on right    Hypertension    states under control with meds., has been on med. x 5 yr.   Skin disease, fungal    on back   Wears partial dentures    upper    Past Surgical History:  Procedure Laterality Date   CARPAL TUNNEL RELEASE Right 08/02/2017   Procedure: RIGHT CARPAL TUNNEL RELEASE;  Surgeon: Alexander Brod, MD;  Location: [place];  Service: Orthopedics;  Laterality: Right;  AX BLOCK   COLONOSCOPY     ULNAR NERVE TRANSPOSITION Right 08/02/2017   Procedure: RIGHT ULNAR NERVE DECOMPRESSION;  Surgeon: Alexander Brod, MD;  Location: [place];  Service: Orthopedics;  Laterality: Right;    Current Medications: Current Meds  Medication Sig   acetaminophen (TYLENOL) 500 MG tablet Take 500 mg by mouth every 6 (six) hours as needed for moderate pain or headache.   furosemide (LASIX) 40 MG tablet Take 40 mg by mouth every morning.   lisinopril (ZESTRIL) 5 MG tablet Take 1 tablet (5 mg total) by mouth daily.   Multiple Vitamins-Minerals (CENTRUM SILVER 50+MEN) TABS Take 1 tablet by mouth daily.   Omega-3 Fatty Acids (FISH OIL) 1000 MG CPDR Take 1,000 mg by mouth daily.   potassium  chloride (KLOR-CON M) 10 MEQ tablet Take 10 mEq by mouth daily.   rosuvastatin (CRESTOR) 5 MG tablet Take 1 tablet (5 mg total) by mouth every evening.   sildenafil (VIAGRA) 100 MG tablet Take 100 mg by mouth daily as needed.   warfarin (COUMADIN) 5 MG tablet Take 1 tablet (5 mg total) by mouth daily.     Allergies:   Patient has no known allergies.   Social History   Socioeconomic History   Marital status: Divorced    Spouse name:  Not on file   Number of children: Not on file   Years of education: Not on file   Highest education level: Not on file  Occupational History   Not on file  Tobacco Use   Smoking status: Never   Smokeless tobacco: Never  Vaping Use   Vaping Use: Never used  Substance and Sexual Activity   Alcohol use: Not Currently    Comment: 7 drinks/week.  05/23/2021-states he stopped drinking 2 weeks ago   Drug use: No   Sexual activity: Not on file  Other Topics Concern   Not on file  Social History Narrative   Not on file   Social Determinants of Health   Financial Resource Strain: Not on file  Food Insecurity: No Food Insecurity (07/04/2022)   Hunger Vital Sign    Worried About Running Out of Food in the Last Year: Never true    Ran Out of Food in the Last Year: Never true  Transportation Needs: No Transportation Needs (07/04/2022)   PRAPARE - Transportation    Lack of Transportation (Medical): No    Lack of Transportation (Non-Medical): No  Physical Activity: Not on file  Stress: Not on file  Social Connections: Not on file     Family History: The patient's family history is not on file.  ROS:   Please see the history of present illness.  All other systems reviewed and are negative.  Labs/Other Studies Reviewed:    The following studies were reviewed today:  Echo 12/01/21   1. RV size and fucntion seem to have improved since July 2022.   2. Left ventricular ejection fraction, by estimation, is 60 to 65%. The  left ventricle has normal function. The left ventricle has no regional  wall motion abnormalities. There is mild left ventricular hypertrophy.  Left ventricular diastolic parameters  were normal.   3. Right ventricular systolic function is normal. The right ventricular  size is normal.   4. The mitral valve is abnormal. No evidence of mitral valve  regurgitation. No evidence of mitral stenosis. Moderate mitral annular  calcification.   5. The aortic valve is  tricuspid. There is moderate calcification of the  aortic valve. There is moderate thickening of the aortic valve. Aortic  valve regurgitation is mild. Aortic valve sclerosis/calcification is  present, without any evidence of aortic  stenosis.   6. Aortic dilatation noted. There is mild dilatation of the ascending  aorta, measuring 40 mm.   7. The inferior vena cava is normal in size with greater than 50%  respiratory variability, suggesting right atrial pressure of 3 mmHg.   Echo 05/25/21   1. Left ventricular ejection fraction, by estimation, is 60 to 65%. The  left ventricle has normal function. The left ventricle has no regional  wall motion abnormalities. There is moderate left ventricular hypertrophy.  Left ventricular diastolic  parameters are consistent with Grade I diastolic dysfunction (impaired  relaxation).   2. Right ventricular systolic  function is moderately reduced. The right  ventricular size is mildly enlarged. Tricuspid regurgitation signal is  inadequate for assessing PA pressure. D-shaped interventricular septum  suggestive of RV pressure/volume  overload.   3. The mitral valve is normal in structure. No evidence of mitral valve  regurgitation. No evidence of mitral stenosis.   4. The aortic valve is tricuspid. Aortic valve regurgitation is trivial.  Mild aortic valve sclerosis is present, with no evidence of aortic valve  stenosis.   5. Aortic dilatation noted. There is mild dilatation of the ascending  aorta, measuring 41 mm.   6. The inferior vena cava is dilated in size with >50% respiratory  variability, suggesting right atrial pressure of 8 mmHg.   7. Technically difficult study, but there is evidence for RV strain on  echo as above.    CT Angio Chest PE 05/24/21  IMPRESSION: Positive for acute PE with CT evidence of right heart strain consistent with at least submassive (intermediate risk) PE. The presence of right heart strain has been associated  with an increased risk of morbidity and mortality. Please refer to the "PE Focused" order set in EPIC.   Recent Labs: 04/19/2022: BUN 6; Creatinine, Ser 0.84; Potassium 3.7; Sodium 140 07/10/2022: ALT 14  Recent Lipid Panel    Component Value Date/Time   CHOL 160 07/10/2022 1105   TRIG 89 07/10/2022 1105   HDL 53 07/10/2022 1105   CHOLHDL 3.0 07/10/2022 1105   CHOLHDL 3.4 05/26/2021 0551   VLDL 17 05/26/2021 0551   LDLCALC 90 07/10/2022 1105     Risk Assessment/Calculations:       Physical Exam:    VS:  BP 116/68   Pulse 91   Ht '5\' 7"'$  (1.702 m)   Wt 229 lb 12.8 oz (104.2 kg)   SpO2 95%   BMI 35.99 kg/m     Wt Readings from Last 3 Encounters:  07/10/22 229 lb 12.8 oz (104.2 kg)  04/19/22 232 lb (105.2 kg)  04/06/22 238 lb 6.4 oz (108.1 kg)     GEN: Well developed, obese male in no acute distress HEENT: Normal NECK: No JVD; No carotid bruits CARDIAC: RRR, no murmurs, rubs, gallops RESPIRATORY:  Clear to auscultation without rales, wheezing or rhonchi  ABDOMEN: Soft, non-tender, non-distended MUSCULOSKELETAL:  1+ pitting edema bilateral LE. No deformity. 2+ pedal pulses, equal bilaterally SKIN: Warm and dry NEUROLOGIC:  Alert and oriented x 3 PSYCHIATRIC:  Normal affect   EKG:  EKG is ordered today.  The ekg ordered today demonstrates sinus rhythm with PACs at 91 bpm, RAD, pulmonary disease pattern, no acute change from previous   Diagnoses:    1. Thoracic aortic aneurysm without rupture, unspecified part ([place])   2. History of pulmonary embolus (PE)   3. Right ventricular enlargement   4. Essential hypertension   5. Nonrheumatic aortic valve insufficiency   6. Hyperlipidemia LDL goal <70    Assessment and Plan:     Hypertension: BP is well-controlled today, better since starting lisinopril. Continue to monitor.  Kidney function and electrolytes stable on current therapy.  Thoracic aortic aneurysm: Mild at 41 mm on echo 11/2021. Asymptomatic. Is avoiding  heavy weight lifting. Will repeat echo 11/2022. Could consider imaging with CT or MR in the future.   Coronary artery calcification/Hyperlipidemia LDL goal < 70: Aortic atherosclerosis and coronary artery calcification seen on prior imaging. Lengthy discussion about limiting alcohol to no more than recommended amount per CDC guidelines.  Started Robles-dose rosuvastatin 3 months  ago and is due for fasting labs today. Encouraged heart healthy diet, like Mediterranean along with regular physical activity.   History of PE with cor pulmonale: Acute PE with right heart strain 05/2021. RV strain resolved on repeat imaging. Feeling well today. No dyspnea, orthopnea, PND, chest pain today.  Scheduled for repeat echocardiogram to evaluate RV function 11/2022. Continue coumadin. Management of INR per PCP office.   Aortic valve insufficiency: Mild on echo 12/01/21. No significant murmur on exam. He is asymptomatic. Plan for repeat echo 11/2022.    Disposition: 5 months with Dr. Gasper Sells with echo prior  Medication Adjustments/Labs and Tests Ordered: Current medicines are reviewed at length with the patient today.  Concerns regarding medicines are outlined above.  Orders Placed This Encounter  Procedures   EKG 12-Lead   No orders of the defined types were placed in this encounter.   Patient Instructions  Medication Instructions:   Your physician recommends that you continue on your current medications as directed. Please refer to the Current Medication list given to you today.   *If you need a refill on your cardiac medications before your next appointment, please call your pharmacy*   Lab Work:  None ordered.  If you have labs (blood work) drawn today and your tests are completely normal, you will receive your results only by: [place] (if you have MyChart) OR A paper copy in the mail If you have any lab test that is abnormal or we need to change your treatment, we will call you to  review the results.   Testing/Procedures:  Keep appointment on Friday, January 5 @ 10:10 am for Echo.   Follow-Up: At [facility], you and your health needs are our priority.  As part of our continuing mission to provide you with exceptional heart care, we have created designated Provider Care Teams.  These Care Teams include your primary Cardiologist (physician) and Advanced Practice Providers (APPs -  Physician Assistants and Nurse Practitioners) who all work together to provide you with the care you need, when you need it.  We recommend signing up for the patient portal called "MyChart".  Sign up information is provided on this After Visit Summary.  MyChart is used to connect with patients for Virtual Visits (Telemedicine).  Patients are able to view lab/test results, encounter notes, upcoming appointments, etc.  Non-urgent messages can be sent to your provider as well.   To learn more about what you can do with MyChart, go to [email or web address].    Your next appointment:   5 month(s)  The format for your next appointment:   In Person  Provider:   Werner Lean, MD     Other Instructions  Mediterranean Diet A Mediterranean diet refers to food and lifestyle choices that are based on the traditions of countries located on the [place]. It focuses on eating more fruits, vegetables, whole grains, beans, nuts, seeds, and heart-healthy fats, and eating less dairy, meat, eggs, and processed foods with added sugar, salt, and fat. This way of eating has been shown to help prevent certain conditions and improve outcomes for people who have chronic diseases, like kidney disease and heart disease. What are tips for following this plan? Reading food labels Check the serving size of packaged foods. For foods such as rice and pasta, the serving size refers to the amount of cooked product, not dry. Check the total fat in packaged foods. Avoid foods that have  saturated fat or trans fats. Check the  ingredient list for added sugars, such as corn syrup. Shopping  Buy a variety of foods that offer a balanced diet, including: Fresh fruits and vegetables (produce). Grains, beans, nuts, and seeds. Some of these may be available in unpackaged forms or large amounts (in bulk). Fresh seafood. Poultry and eggs. Robles-fat dairy products. Buy whole ingredients instead of prepackaged foods. Buy fresh fruits and vegetables in-season from local farmers markets. Buy plain frozen fruits and vegetables. If you do not have access to quality fresh seafood, buy precooked frozen shrimp or canned fish, such as tuna, salmon, or sardines. Stock your pantry so you always have certain foods on hand, such as olive oil, canned tuna, canned tomatoes, rice, pasta, and beans. Cooking Cook foods with extra-virgin olive oil instead of using butter or other vegetable oils. Have meat as a side dish, and have vegetables or grains as your main dish. This means having meat in small portions or adding small amounts of meat to foods like pasta or stew. Use beans or vegetables instead of meat in common dishes like chili or lasagna. Experiment with different cooking methods. Try roasting, broiling, steaming, and sauting vegetables. Add frozen vegetables to soups, stews, pasta, or rice. Add nuts or seeds for added healthy fats and plant protein at each meal. You can add these to yogurt, salads, or vegetable dishes. Marinate fish or vegetables using olive oil, lemon juice, garlic, and fresh herbs. Meal planning Plan to eat one vegetarian meal one day each week. Try to work up to two vegetarian meals, if possible. Eat seafood two or more times a week. Have healthy snacks readily available, such as: Vegetable sticks with hummus. Greek yogurt. Fruit and nut trail mix. Eat balanced meals throughout the week. This includes: Fruit: 2-3 servings a day. Vegetables: 4-5 servings a day. Robles-fat  dairy: 2 servings a day. Fish, poultry, or Robles meat: 1 serving a day. Beans and legumes: 2 or more servings a week. Nuts and seeds: 1-2 servings a day. Whole grains: 6-8 servings a day. Extra-virgin olive oil: 3-4 servings a day. Limit red meat and sweets to only a few servings a month. Lifestyle  Cook and eat meals together with your family, when possible. Drink enough fluid to keep your urine pale yellow. Be physically active every day. This includes: Aerobic exercise like running or swimming. Leisure activities like gardening, walking, or housework. Get 7-8 hours of sleep each night. If recommended by your health care provider, drink red wine in moderation. This means 1 glass a day for nonpregnant women and 2 glasses a day for men. A glass of wine equals 5 oz (150 mL). What foods should I eat? Fruits Apples. Apricots. Avocado. Berries. Bananas. Cherries. Dates. Figs. Grapes. Lemons. Melon. Oranges. Peaches. Plums. Pomegranate. Vegetables Artichokes. Beets. Broccoli. Cabbage. Carrots. Eggplant. Green beans. Chard. Kale. Spinach. Onions. Leeks. Peas. Squash. Tomatoes. Peppers. Radishes. Grains Whole-grain pasta. Brown rice. Bulgur wheat. Polenta. Couscous. Whole-wheat bread. [place]. Meats and other proteins Beans. Almonds. Sunflower seeds. Pine nuts. Peanuts. [place]. Salmon. Scallops. Shrimp. [place]. Tilapia. Clams. Oysters. Eggs. Poultry without skin. Dairy Robles-fat milk. Cheese. Greek yogurt. Fats and oils Extra-virgin olive oil. Avocado oil. Grapeseed oil. Beverages Water. Red wine. Herbal tea. Sweets and desserts Greek yogurt with honey. Baked apples. Poached pears. Trail mix. Seasonings and condiments Basil. Cilantro. Coriander. Cumin. Mint. Parsley. Sage. Rosemary. Tarragon. Garlic. Oregano. Thyme. Pepper. Balsamic vinegar. Tahini. Hummus. Tomato sauce. Olives. Mushrooms. The items listed above may not be a complete list of foods and  beverages you can eat. Contact a  dietitian for more information. What foods should I limit? This is a list of foods that should be eaten rarely or only on special occasions. Fruits Fruit canned in syrup. Vegetables Deep-fried potatoes (french fries). Grains Prepackaged pasta or rice dishes. Prepackaged cereal with added sugar. Prepackaged snacks with added sugar. Meats and other proteins Beef. Pork. Lamb. Poultry with skin. Hot dogs. [place] Salines. Dairy Ice cream. Sour cream. Whole milk. Fats and oils Butter. Canola oil. Vegetable oil. Beef fat (tallow). Lard. Beverages Juice. Sugar-sweetened soft drinks. Beer. Liquor and spirits. Sweets and desserts Cookies. Cakes. Pies. Candy. Seasonings and condiments Mayonnaise. Pre-made sauces and marinades. The items listed above may not be a complete list of foods and beverages you should limit. Contact a dietitian for more information. Summary The Mediterranean diet includes both food and lifestyle choices. Eat a variety of fresh fruits and vegetables, beans, nuts, seeds, and whole grains. Limit the amount of red meat and sweets that you eat. If recommended by your health care provider, drink red wine in moderation. This means 1 glass a day for nonpregnant women and 2 glasses a day for men. A glass of wine equals 5 oz (150 mL). This information is not intended to replace advice given to you by your health care provider. Make sure you discuss any questions you have with your health care provider. Document Revised: 12/04/2019 Document Reviewed: 10/01/2019 Elsevier Patient Education  [place] Eating Plan DASH stands for Dietary Approaches to Stop Hypertension. The DASH eating plan is a healthy eating plan that has been shown to: Reduce high blood pressure (hypertension). Reduce your risk for type 2 diabetes, heart disease, and stroke. Help with weight loss. What are tips for following this plan? Reading food labels Check food labels for the amount of salt  (sodium) per serving. Choose foods with less than 5 percent of the Daily Value of sodium. Generally, foods with less than 300 milligrams (mg) of sodium per serving fit into this eating plan. To find whole grains, look for the word "whole" as the first word in the ingredient list. Shopping Buy products labeled as "Robles-sodium" or "no salt added." Buy fresh foods. Avoid canned foods and pre-made or frozen meals. Cooking Avoid adding salt when cooking. Use salt-free seasonings or herbs instead of table salt or sea salt. Check with your health care provider or pharmacist before using salt substitutes. Do not fry foods. Cook foods using healthy methods such as baking, boiling, grilling, roasting, and broiling instead. Cook with heart-healthy oils, such as olive, canola, avocado, soybean, or sunflower oil. Meal planning  Eat a balanced diet that includes: 4 or more servings of fruits and 4 or more servings of vegetables each day. Try to fill one-half of your plate with fruits and vegetables. 6-8 servings of whole grains each day. Less than 6 oz (170 g) of Robles meat, poultry, or fish each day. A 3-oz (85-g) serving of meat is about the same size as a deck of cards. One egg equals 1 oz (28 g). 2-3 servings of Robles-fat dairy each day. One serving is 1 cup (237 mL). 1 serving of nuts, seeds, or beans 5 times each week. 2-3 servings of heart-healthy fats. Healthy fats called omega-3 fatty acids are found in foods such as walnuts, flaxseeds, fortified milks, and eggs. These fats are also found in cold-water fish, such as sardines, salmon, and mackerel. Limit how much you eat of: Canned or prepackaged foods. Food  that is high in trans fat, such as some fried foods. Food that is high in saturated fat, such as fatty meat. Desserts and other sweets, sugary drinks, and other foods with added sugar. Full-fat dairy products. Do not salt foods before eating. Do not eat more than 4 egg yolks a week. Try to eat at  least 2 vegetarian meals a week. Eat more home-cooked food and less restaurant, buffet, and fast food. Lifestyle When eating at a restaurant, ask that your food be prepared with less salt or no salt, if possible. If you drink alcohol: Limit how much you use to: 0-1 drink a day for women who are not pregnant. 0-2 drinks a day for men. Be aware of how much alcohol is in your drink. In the [place] one drink equals one 12 oz bottle of beer (355 mL), one 5 oz glass of wine (148 mL), or one 1 oz glass of hard liquor (44 mL). General information Avoid eating more than 2,300 mg of salt a day. If you have hypertension, you may need to reduce your sodium intake to 1,500 mg a day. Work with your health care provider to maintain a healthy body weight or to lose weight. Ask what an ideal weight is for you. Get at least 30 minutes of exercise that causes your heart to beat faster (aerobic exercise) most days of the week. Activities may include walking, swimming, or biking. Work with your health care provider or dietitian to adjust your eating plan to your individual calorie needs. What foods should I eat? Fruits All fresh, dried, or frozen fruit. Canned fruit in natural juice (without added sugar). Vegetables Fresh or frozen vegetables (raw, steamed, roasted, or grilled). Robles-sodium or reduced-sodium tomato and vegetable juice. Robles-sodium or reduced-sodium tomato sauce and tomato paste. Robles-sodium or reduced-sodium canned vegetables. Grains Whole-grain or whole-wheat bread. Whole-grain or whole-wheat pasta. Brown rice. [place]. Bulgur. Whole-grain and Robles-sodium cereals. Pita bread. Robles-fat, Robles-sodium crackers. Whole-wheat flour tortillas. Meats and other proteins Skinless chicken or [place]. Ground chicken or [place]. Pork with fat trimmed off. Fish and seafood. Egg whites. Dried beans, peas, or lentils. Unsalted nuts, nut butters, and seeds. Unsalted canned beans. Robles cuts of beef with fat trimmed  off. Robles-sodium, Robles precooked or cured meat, such as sausages or meat loaves. Dairy Robles-fat (1%) or fat-free (skim) milk. Reduced-fat, Robles-fat, or fat-free cheeses. Nonfat, Robles-sodium ricotta or cottage cheese. Robles-fat or nonfat yogurt. Robles-fat, Robles-sodium cheese. Fats and oils Soft margarine without trans fats. Vegetable oil. Reduced-fat, Robles-fat, or light mayonnaise and salad dressings (reduced-sodium). Canola, safflower, olive, avocado, soybean, and sunflower oils. Avocado. Seasonings and condiments Herbs. Spices. Seasoning mixes without salt. Other foods Unsalted popcorn and pretzels. Fat-free sweets. The items listed above may not be a complete list of foods and beverages you can eat. Contact a dietitian for more information. What foods should I avoid? Fruits Canned fruit in a light or heavy syrup. Fried fruit. Fruit in cream or butter sauce. Vegetables Creamed or fried vegetables. Vegetables in a cheese sauce. Regular canned vegetables (not Robles-sodium or reduced-sodium). Regular canned tomato sauce and paste (not Robles-sodium or reduced-sodium). Regular tomato and vegetable juice (not Robles-sodium or reduced-sodium). Angie Fava. Olives. Grains Baked goods made with fat, such as croissants, muffins, or some breads. Dry pasta or rice meal packs. Meats and other proteins Fatty cuts of meat. Ribs. Fried meat. Berniece Salines. Bologna, salami, and other precooked or cured meats, such as sausages or meat loaves. Fat from the back of a pig (  fatback). Bratwurst. Salted nuts and seeds. Canned beans with added salt. Canned or smoked fish. Whole eggs or egg yolks. Chicken or [place] with skin. Dairy Whole or 2% milk, cream, and half-and-half. Whole or full-fat cream cheese. Whole-fat or sweetened yogurt. Full-fat cheese. Nondairy creamers. Whipped toppings. Processed cheese and cheese spreads. Fats and oils Butter. Stick margarine. Lard. Shortening. Ghee. Bacon fat. Tropical oils, such as coconut, palm kernel, or  palm oil. Seasonings and condiments Onion salt, garlic salt, seasoned salt, table salt, and sea salt. Worcestershire sauce. Tartar sauce. Barbecue sauce. Teriyaki sauce. Soy sauce, including reduced-sodium. Steak sauce. Canned and packaged gravies. Fish sauce. Oyster sauce. Cocktail sauce. Store-bought horseradish. Ketchup. Mustard. Meat flavorings and tenderizers. Bouillon cubes. Hot sauces. Pre-made or packaged marinades. Pre-made or packaged taco seasonings. Relishes. Regular salad dressings. Other foods Salted popcorn and pretzels. The items listed above may not be a complete list of foods and beverages you should avoid. Contact a dietitian for more information. Where to find more information National Heart, Lung, and Blood Institute: https://wilson-eaton.com/ American Heart Association: www.heart.org Academy of Nutrition and Dietetics: www.eatright.[place]: www.kidney.org Summary The DASH eating plan is a healthy eating plan that has been shown to reduce high blood pressure (hypertension). It may also reduce your risk for type 2 diabetes, heart disease, and stroke. When on the DASH eating plan, aim to eat more fresh fruits and vegetables, whole grains, Robles proteins, Robles-fat dairy, and heart-healthy fats. With the DASH eating plan, you should limit salt (sodium) intake to 2,300 mg a day. If you have hypertension, you may need to reduce your sodium intake to 1,500 mg a day. Work with your health care provider or dietitian to adjust your eating plan to your individual calorie needs. This information is not intended to replace advice given to you by your health care provider. Make sure you discuss any questions you have with your health care provider. Document Revised: 10/02/2019 Document Reviewed: 10/02/2019 Elsevier Patient Education  [place]         Signed, Emmaline Life, NP  07/10/2022 4:56 PM

## 2022-07-09 ENCOUNTER — Telehealth: Payer: Self-pay

## 2022-07-09 ENCOUNTER — Telehealth (HOSPITAL_COMMUNITY): Payer: Self-pay | Admitting: Internal Medicine

## 2022-07-09 NOTE — Telephone Encounter (Addendum)
Called pt to confirm that he is planning to have Warfarin/Coumadin managed with [facility] and he politely stated he does not and prefers Dr. Lawana Pai . He does not have an appt for Anticoagulation monitoring and per this message he wanted to come to [facility] for Warfarin management.   Pt states he will go to Dr. Lysle Rubens tomorrow at 12pm after his appt with [facility] to have his levels checked. He states politely that he did not tell anyone anything different from what he told me earlier. Also, he states he spoke with someone from Dr. Glenna Durand office and they told him to come tomorrow and they would check and adjust then send in his warfarin refill. Advised that I will note this again and if he has any issues to call me back. He asked about the cost of Eliquis and stated he cannot afford the $500 and really wants the Eliquis. Apologized for the inconvenience and he was thankful for the second follow up call. Confirmed with him before hanging up phone that he is having Dr. Lysle Rubens to monitor and he stated he is going on tomorrow and will continue to go there since he is familiar with them.

## 2022-07-09 NOTE — Telephone Encounter (Signed)
Caller stated patient had not had a coumadin check in quite a while.  Caller stated that patient had a coumadin check on Friday, 8/25 and patient stated he will resume having coumadin checks at [facility]

## 2022-07-09 NOTE — Telephone Encounter (Signed)
Pt states Dr. Sherilyn Cooter office is monitoring warfarin levels. Will not send in a refill as [facility] has not been monitoring. See phone note from today as pt declined [facility] appt for  warfarin monitoring.

## 2022-07-09 NOTE — Telephone Encounter (Signed)
done

## 2022-07-09 NOTE — Telephone Encounter (Signed)
Called the pt and he stated he has been going to Dr. Deforest Hoyles for warfarin care and they told him what to do with the warfarin last week. He states they did not refill the warfarin but he stopped drinking alcohol and has been taking warfarin medication daily since he cannot afford eliquis. He states the eliquis is expensive and cannot afford the $500 they are wanting.  Asked if he would like to come to our [facility] office since I had him on the phone and he politely declined and stated he will stay with Dr. Deforest Hoyles. Pt states he will go back to Dr. Deforest Hoyles to have this monitored. Advised that he needs to keep any upcoming appts that he has with them if he is going to have them monitor the INR/warfarin. Then he stated he has an appt at [facility] tomorrow and advised that is correct with the provider and that we are in a different building. He stated he will stay with Dr. Deforest Hoyles for management of this medication. He states he is out of warfarin and they did not fill it advised it is probably because he reported to them that [facility] was managing. He stated he wants Deforest Hoyles to manage since he is familiar with them. Advised that he will need to get an appt this week with them if he wants to stay with Dr. Deforest Hoyles. Also, advised that warfarin requires weekly monitoring until the dose is tailored to the pt and he verbalized understanding.  Pt is aware that since we are not managing we cannot send in refills to the warfarin and he stated he would call Dr. Deforest Hoyles office after we get off the phone.   Called and left a message for Dr. Sherilyn Cooter Medical Assistant [place] regarding this pt and that he needs a refill on the warfarin and that he reports he wants them to continue to manage.

## 2022-07-10 ENCOUNTER — Ambulatory Visit: Payer: Medicare Other | Attending: Nurse Practitioner | Admitting: Nurse Practitioner

## 2022-07-10 ENCOUNTER — Encounter: Payer: Self-pay | Admitting: Nurse Practitioner

## 2022-07-10 ENCOUNTER — Ambulatory Visit: Payer: Medicare Other

## 2022-07-10 VITALS — BP 116/68 | HR 91 | Ht 67.0 in | Wt 229.8 lb

## 2022-07-10 DIAGNOSIS — I351 Nonrheumatic aortic (valve) insufficiency: Secondary | ICD-10-CM

## 2022-07-10 DIAGNOSIS — I700 Atherosclerosis of aorta: Secondary | ICD-10-CM | POA: Diagnosis not present

## 2022-07-10 DIAGNOSIS — I712 Thoracic aortic aneurysm, without rupture, unspecified: Secondary | ICD-10-CM | POA: Diagnosis not present

## 2022-07-10 DIAGNOSIS — Z86711 Personal history of pulmonary embolism: Secondary | ICD-10-CM

## 2022-07-10 DIAGNOSIS — E785 Hyperlipidemia, unspecified: Secondary | ICD-10-CM

## 2022-07-10 DIAGNOSIS — I10 Essential (primary) hypertension: Secondary | ICD-10-CM | POA: Diagnosis not present

## 2022-07-10 DIAGNOSIS — I517 Cardiomegaly: Secondary | ICD-10-CM

## 2022-07-10 DIAGNOSIS — Z5181 Encounter for therapeutic drug level monitoring: Secondary | ICD-10-CM | POA: Diagnosis not present

## 2022-07-10 LAB — LIPID PANEL
Chol/HDL Ratio: 3 ratio (ref 0.0–5.0)
Cholesterol, Total: 160 mg/dL (ref 100–199)
HDL: 53 mg/dL (ref >39)
LDL Chol Calc (NIH): 90 mg/dL (ref 0–99)
Triglycerides: 89 mg/dL (ref 0–149)
VLDL Cholesterol Cal: 17 mg/dL (ref 5–40)

## 2022-07-10 LAB — ALT: ALT: 14 IU/L (ref 0–44)

## 2022-07-10 NOTE — Patient Instructions (Signed)
Medication Instructions:   Your physician recommends that you continue on your current medications as directed. Please refer to the Current Medication list given to you today.   *If you need a refill on your cardiac medications before your next appointment, please call your pharmacy*   Lab Work:  None ordered.  If you have labs (blood work) drawn today and your tests are completely normal, you will receive your results only by: [place] (if you have MyChart) OR A paper copy in the mail If you have any lab test that is abnormal or we need to change your treatment, we will call you to review the results.   Testing/Procedures:  Keep appointment on Friday, January 5 @ 10:10 am for Echo.   Follow-Up: At [facility], you and your health needs are our priority.  As part of our continuing mission to provide you with exceptional heart care, we have created designated Provider Care Teams.  These Care Teams include your primary Cardiologist (physician) and Advanced Practice Providers (APPs -  Physician Assistants and Nurse Practitioners) who all work together to provide you with the care you need, when you need it.  We recommend signing up for the patient portal called "MyChart".  Sign up information is provided on this After Visit Summary.  MyChart is used to connect with patients for Virtual Visits (Telemedicine).  Patients are able to view lab/test results, encounter notes, upcoming appointments, etc.  Non-urgent messages can be sent to your provider as well.   To learn more about what you can do with MyChart, go to [email or web address].    Your next appointment:   5 month(s)  The format for your next appointment:   In Person  Provider:   Werner Lean, MD     Other Instructions  Mediterranean Diet A Mediterranean diet refers to food and lifestyle choices that are based on the traditions of countries located on the [place]. It focuses on eating  more fruits, vegetables, whole grains, beans, nuts, seeds, and heart-healthy fats, and eating less dairy, meat, eggs, and processed foods with added sugar, salt, and fat. This way of eating has been shown to help prevent certain conditions and improve outcomes for people who have chronic diseases, like kidney disease and heart disease. What are tips for following this plan? Reading food labels Check the serving size of packaged foods. For foods such as rice and pasta, the serving size refers to the amount of cooked product, not dry. Check the total fat in packaged foods. Avoid foods that have saturated fat or trans fats. Check the ingredient list for added sugars, such as corn syrup. Shopping  Buy a variety of foods that offer a balanced diet, including: Fresh fruits and vegetables (produce). Grains, beans, nuts, and seeds. Some of these may be available in unpackaged forms or large amounts (in bulk). Fresh seafood. Poultry and eggs. Low-fat dairy products. Buy whole ingredients instead of prepackaged foods. Buy fresh fruits and vegetables in-season from local farmers markets. Buy plain frozen fruits and vegetables. If you do not have access to quality fresh seafood, buy precooked frozen shrimp or canned fish, such as tuna, salmon, or sardines. Stock your pantry so you always have certain foods on hand, such as olive oil, canned tuna, canned tomatoes, rice, pasta, and beans. Cooking Cook foods with extra-virgin olive oil instead of using butter or other vegetable oils. Have meat as a side dish, and have vegetables or grains as your main dish. This means  having meat in small portions or adding small amounts of meat to foods like pasta or stew. Use beans or vegetables instead of meat in common dishes like chili or lasagna. Experiment with different cooking methods. Try roasting, broiling, steaming, and sauting vegetables. Add frozen vegetables to soups, stews, pasta, or rice. Add nuts or  seeds for added healthy fats and plant protein at each meal. You can add these to yogurt, salads, or vegetable dishes. Marinate fish or vegetables using olive oil, lemon juice, garlic, and fresh herbs. Meal planning Plan to eat one vegetarian meal one day each week. Try to work up to two vegetarian meals, if possible. Eat seafood two or more times a week. Have healthy snacks readily available, such as: Vegetable sticks with hummus. Greek yogurt. Fruit and nut trail mix. Eat balanced meals throughout the week. This includes: Fruit: 2-3 servings a day. Vegetables: 4-5 servings a day. Low-fat dairy: 2 servings a day. Fish, poultry, or lean meat: 1 serving a day. Beans and legumes: 2 or more servings a week. Nuts and seeds: 1-2 servings a day. Whole grains: 6-8 servings a day. Extra-virgin olive oil: 3-4 servings a day. Limit red meat and sweets to only a few servings a month. Lifestyle  Cook and eat meals together with your family, when possible. Drink enough fluid to keep your urine pale yellow. Be physically active every day. This includes: Aerobic exercise like running or swimming. Leisure activities like gardening, walking, or housework. Get 7-8 hours of sleep each night. If recommended by your health care provider, drink red wine in moderation. This means 1 glass a day for nonpregnant women and 2 glasses a day for men. A glass of wine equals 5 oz (150 mL). What foods should I eat? Fruits Apples. Apricots. Avocado. Berries. Bananas. Cherries. Dates. Figs. Grapes. Lemons. Melon. Oranges. Peaches. Plums. Pomegranate. Vegetables Artichokes. Beets. Broccoli. Cabbage. Carrots. Eggplant. Green beans. Chard. Kale. Spinach. Onions. Leeks. Peas. Squash. Tomatoes. Peppers. Radishes. Grains Whole-grain pasta. Brown rice. Bulgur wheat. Polenta. Couscous. Whole-wheat bread. [place]. Meats and other proteins Beans. Almonds. Sunflower seeds. Pine nuts. Peanuts. Pinon. Salmon. Scallops.  Shrimp. Dul[place]. Tilapia. Clams. Oysters. Eggs. Poultry without skin. Dairy Low-fat milk. Cheese. Greek yogurt. Fats and oils Extra-virgin olive oil. Avocado oil. Grapeseed oil. Beverages Water. Red wine. Herbal tea. Sweets and desserts Greek yogurt with honey. Baked apples. Poached pears. Trail mix. Seasonings and condiments Basil. Cilantro. Coriander. Cumin. Mint. Parsley. Sage. Rosemary. Tarragon. Garlic. Oregano. Thyme. Pepper. Balsamic vinegar. Tahini. Hummus. Tomato sauce. Olives. Mushrooms. The items listed above may not be a complete list of foods and beverages you can eat. Contact a dietitian for more information. What foods should I limit? This is a list of foods that should be eaten rarely or only on special occasions. Fruits Fruit canned in syrup. Vegetables Deep-fried potatoes (french fries). Grains Prepackaged pasta or rice dishes. Prepackaged cereal with added sugar. Prepackaged snacks with added sugar. Meats and other proteins Beef. Pork. Lamb. Poultry with skin. Hot dogs. Berniece Salines. Dairy Ice cream. Sour cream. Whole milk. Fats and oils Butter. Canola oil. Vegetable oil. Beef fat (tallow). Lard. Beverages Juice. Sugar-sweetened soft drinks. Beer. Liquor and spirits. Sweets and desserts Cookies. Cakes. Pies. Candy. Seasonings and condiments Mayonnaise. Pre-made sauces and marinades. The items listed above may not be a complete list of foods and beverages you should limit. Contact a dietitian for more information. Summary The Mediterranean diet includes both food and lifestyle choices. Eat a variety of fresh fruits and vegetables,  beans, nuts, seeds, and whole grains. Limit the amount of red meat and sweets that you eat. If recommended by your health care provider, drink red wine in moderation. This means 1 glass a day for nonpregnant women and 2 glasses a day for men. A glass of wine equals 5 oz (150 mL). This information is not intended to replace advice given to you  by your health care provider. Make sure you discuss any questions you have with your health care provider. Document Revised: 12/04/2019 Document Reviewed: 10/01/2019 Elsevier Patient Education  [place] Eating Plan DASH stands for Dietary Approaches to Stop Hypertension. The DASH eating plan is a healthy eating plan that has been shown to: Reduce high blood pressure (hypertension). Reduce your risk for type 2 diabetes, heart disease, and stroke. Help with weight loss. What are tips for following this plan? Reading food labels Check food labels for the amount of salt (sodium) per serving. Choose foods with less than 5 percent of the Daily Value of sodium. Generally, foods with less than 300 milligrams (mg) of sodium per serving fit into this eating plan. To find whole grains, look for the word "whole" as the first word in the ingredient list. Shopping Buy products labeled as "low-sodium" or "no salt added." Buy fresh foods. Avoid canned foods and pre-made or frozen meals. Cooking Avoid adding salt when cooking. Use salt-free seasonings or herbs instead of table salt or sea salt. Check with your health care provider or pharmacist before using salt substitutes. Do not fry foods. Cook foods using healthy methods such as baking, boiling, grilling, roasting, and broiling instead. Cook with heart-healthy oils, such as olive, canola, avocado, soybean, or sunflower oil. Meal planning  Eat a balanced diet that includes: 4 or more servings of fruits and 4 or more servings of vegetables each day. Try to fill one-half of your plate with fruits and vegetables. 6-8 servings of whole grains each day. Less than 6 oz (170 g) of lean meat, poultry, or fish each day. A 3-oz (85-g) serving of meat is about the same size as a deck of cards. One egg equals 1 oz (28 g). 2-3 servings of low-fat dairy each day. One serving is 1 cup (237 mL). 1 serving of nuts, seeds, or beans 5 times each  week. 2-3 servings of heart-healthy fats. Healthy fats called omega-3 fatty acids are found in foods such as walnuts, flaxseeds, fortified milks, and eggs. These fats are also found in cold-water fish, such as sardines, salmon, and mackerel. Limit how much you eat of: Canned or prepackaged foods. Food that is high in trans fat, such as some fried foods. Food that is high in saturated fat, such as fatty meat. Desserts and other sweets, sugary drinks, and other foods with added sugar. Full-fat dairy products. Do not salt foods before eating. Do not eat more than 4 egg yolks a week. Try to eat at least 2 vegetarian meals a week. Eat more home-cooked food and less restaurant, buffet, and fast food. Lifestyle When eating at a restaurant, ask that your food be prepared with less salt or no salt, if possible. If you drink alcohol: Limit how much you use to: 0-1 drink a day for women who are not pregnant. 0-2 drinks a day for men. Be aware of how much alcohol is in your drink. In the [place] one drink equals one 12 oz bottle of beer (355 mL), one 5 oz glass of wine (148 mL), or one  1 oz glass of hard liquor (44 mL). General information Avoid eating more than 2,300 mg of salt a day. If you have hypertension, you may need to reduce your sodium intake to 1,500 mg a day. Work with your health care provider to maintain a healthy body weight or to lose weight. Ask what an ideal weight is for you. Get at least 30 minutes of exercise that causes your heart to beat faster (aerobic exercise) most days of the week. Activities may include walking, swimming, or biking. Work with your health care provider or dietitian to adjust your eating plan to your individual calorie needs. What foods should I eat? Fruits All fresh, dried, or frozen fruit. Canned fruit in natural juice (without added sugar). Vegetables Fresh or frozen vegetables (raw, steamed, roasted, or grilled). Low-sodium or reduced-sodium tomato and  vegetable juice. Low-sodium or reduced-sodium tomato sauce and tomato paste. Low-sodium or reduced-sodium canned vegetables. Grains Whole-grain or whole-wheat bread. Whole-grain or whole-wheat pasta. Brown rice. [place]. Bulgur. Whole-grain and low-sodium cereals. Pita bread. Low-fat, low-sodium crackers. Whole-wheat flour tortillas. Meats and other proteins Skinless chicken or [place]. Ground chicken or [place]. Pork with fat trimmed off. Fish and seafood. Egg whites. Dried beans, peas, or lentils. Unsalted nuts, nut butters, and seeds. Unsalted canned beans. Lean cuts of beef with fat trimmed off. Low-sodium, lean precooked or cured meat, such as sausages or meat loaves. Dairy Low-fat (1%) or fat-free (skim) milk. Reduced-fat, low-fat, or fat-free cheeses. Nonfat, low-sodium ricotta or cottage cheese. Low-fat or nonfat yogurt. Low-fat, low-sodium cheese. Fats and oils Soft margarine without trans fats. Vegetable oil. Reduced-fat, low-fat, or light mayonnaise and salad dressings (reduced-sodium). Canola, safflower, olive, avocado, soybean, and sunflower oils. Avocado. Seasonings and condiments Herbs. Spices. Seasoning mixes without salt. Other foods Unsalted popcorn and pretzels. Fat-free sweets. The items listed above may not be a complete list of foods and beverages you can eat. Contact a dietitian for more information. What foods should I avoid? Fruits Canned fruit in a light or heavy syrup. Fried fruit. Fruit in cream or butter sauce. Vegetables Creamed or fried vegetables. Vegetables in a cheese sauce. Regular canned vegetables (not low-sodium or reduced-sodium). Regular canned tomato sauce and paste (not low-sodium or reduced-sodium). Regular tomato and vegetable juice (not low-sodium or reduced-sodium). Angie Fava. Olives. Grains Baked goods made with fat, such as croissants, muffins, or some breads. Dry pasta or rice meal packs. Meats and other proteins Fatty cuts of meat. Ribs.  Fried meat. Berniece Salines. Bologna, salami, and other precooked or cured meats, such as sausages or meat loaves. Fat from the back of a pig (fatback). Bratwurst. Salted nuts and seeds. Canned beans with added salt. Canned or smoked fish. Whole eggs or egg yolks. Chicken or [place] with skin. Dairy Whole or 2% milk, cream, and half-and-half. Whole or full-fat cream cheese. Whole-fat or sweetened yogurt. Full-fat cheese. Nondairy creamers. Whipped toppings. Processed cheese and cheese spreads. Fats and oils Butter. Stick margarine. Lard. Shortening. Ghee. Bacon fat. Tropical oils, such as coconut, palm kernel, or palm oil. Seasonings and condiments Onion salt, garlic salt, seasoned salt, table salt, and sea salt. Worcestershire sauce. Tartar sauce. Barbecue sauce. Teriyaki sauce. Soy sauce, including reduced-sodium. Steak sauce. Canned and packaged gravies. Fish sauce. Oyster sauce. Cocktail sauce. Store-bought horseradish. Ketchup. Mustard. Meat flavorings and tenderizers. Bouillon cubes. Hot sauces. Pre-made or packaged marinades. Pre-made or packaged taco seasonings. Relishes. Regular salad dressings. Other foods Salted popcorn and pretzels. The items listed above may not be a complete list of  foods and beverages you should avoid. Contact a dietitian for more information. Where to find more information National Heart, Lung, and Blood Institute: https://wilson-eaton.com/ American Heart Association: www.heart.org Academy of Nutrition and Dietetics: www.eatright.[place]: www.kidney.org Summary The DASH eating plan is a healthy eating plan that has been shown to reduce high blood pressure (hypertension). It may also reduce your risk for type 2 diabetes, heart disease, and stroke. When on the DASH eating plan, aim to eat more fresh fruits and vegetables, whole grains, lean proteins, low-fat dairy, and heart-healthy fats. With the DASH eating plan, you should limit salt (sodium) intake to 2,300 mg  a day. If you have hypertension, you may need to reduce your sodium intake to 1,500 mg a day. Work with your health care provider or dietitian to adjust your eating plan to your individual calorie needs. This information is not intended to replace advice given to you by your health care provider. Make sure you discuss any questions you have with your health care provider. Document Revised: 10/02/2019 Document Reviewed: 10/02/2019 Elsevier Patient Education  [place]

## 2022-07-17 DIAGNOSIS — Z7901 Long term (current) use of anticoagulants: Secondary | ICD-10-CM | POA: Diagnosis not present

## 2022-07-17 DIAGNOSIS — I10 Essential (primary) hypertension: Secondary | ICD-10-CM | POA: Diagnosis not present

## 2022-07-19 ENCOUNTER — Telehealth: Payer: Self-pay

## 2022-07-19 DIAGNOSIS — E785 Hyperlipidemia, unspecified: Secondary | ICD-10-CM

## 2022-07-19 DIAGNOSIS — I700 Atherosclerosis of aorta: Secondary | ICD-10-CM

## 2022-07-19 MED ORDER — ROSUVASTATIN CALCIUM 10 MG PO TABS: 10.0000 mg | ORAL_TABLET | Freq: Every day | ORAL | 3 refills | Status: DC

## 2022-07-19 NOTE — Telephone Encounter (Signed)
The patient has been notified of the result and verbalized understanding.  All questions (if any) were answered. [place], RN 07/19/2022 10:11 AM   Pt will have f/u FLP, ALT on 11/16/21 same day as Echo appointment.  Appointment reminder sent to pt.

## 2022-07-19 NOTE — Telephone Encounter (Signed)
-----   Message from Werner Lean, MD sent at 07/15/2022  3:59 PM EDT ----- LDL above goal: rosuvastatin to 10 and labs in three months. ----- Message ----- From: Interface, [facility] Lab Results In Sent: 07/10/2022   4:37 PM EDT To: Werner Lean, MD

## 2022-07-23 DIAGNOSIS — I10 Essential (primary) hypertension: Secondary | ICD-10-CM | POA: Diagnosis not present

## 2022-07-23 DIAGNOSIS — Z7901 Long term (current) use of anticoagulants: Secondary | ICD-10-CM | POA: Diagnosis not present

## 2022-09-21 DIAGNOSIS — Z23 Encounter for immunization: Secondary | ICD-10-CM | POA: Diagnosis not present

## 2022-09-21 DIAGNOSIS — M519 Unspecified thoracic, thoracolumbar and lumbosacral intervertebral disc disorder: Secondary | ICD-10-CM | POA: Diagnosis not present

## 2022-09-21 DIAGNOSIS — I5030 Unspecified diastolic (congestive) heart failure: Secondary | ICD-10-CM | POA: Diagnosis not present

## 2022-09-21 DIAGNOSIS — Z86718 Personal history of other venous thrombosis and embolism: Secondary | ICD-10-CM | POA: Diagnosis not present

## 2022-09-21 DIAGNOSIS — E782 Mixed hyperlipidemia: Secondary | ICD-10-CM | POA: Diagnosis not present

## 2022-09-21 DIAGNOSIS — Z7901 Long term (current) use of anticoagulants: Secondary | ICD-10-CM | POA: Diagnosis not present

## 2022-09-21 DIAGNOSIS — R7303 Prediabetes: Secondary | ICD-10-CM | POA: Diagnosis not present

## 2022-09-21 DIAGNOSIS — Z86711 Personal history of pulmonary embolism: Secondary | ICD-10-CM | POA: Diagnosis not present

## 2022-09-21 DIAGNOSIS — I10 Essential (primary) hypertension: Secondary | ICD-10-CM | POA: Diagnosis not present

## 2022-09-27 DIAGNOSIS — Z7901 Long term (current) use of anticoagulants: Secondary | ICD-10-CM | POA: Diagnosis not present

## 2022-10-11 DIAGNOSIS — Z7901 Long term (current) use of anticoagulants: Secondary | ICD-10-CM | POA: Diagnosis not present

## 2022-11-16 ENCOUNTER — Ambulatory Visit: Payer: Medicare Other | Attending: Internal Medicine

## 2022-11-16 ENCOUNTER — Ambulatory Visit: Payer: Medicare Other

## 2022-11-16 DIAGNOSIS — I351 Nonrheumatic aortic (valve) insufficiency: Secondary | ICD-10-CM | POA: Diagnosis not present

## 2022-11-16 DIAGNOSIS — I712 Thoracic aortic aneurysm, without rupture, unspecified: Secondary | ICD-10-CM | POA: Diagnosis not present

## 2022-11-16 DIAGNOSIS — E785 Hyperlipidemia, unspecified: Secondary | ICD-10-CM

## 2022-11-16 DIAGNOSIS — I700 Atherosclerosis of aorta: Secondary | ICD-10-CM

## 2022-11-16 LAB — LIPID PANEL
Chol/HDL Ratio: 1.5 ratio (ref 0.0–5.0)
Cholesterol, Total: 156 mg/dL (ref 100–199)
HDL: 107 mg/dL (ref >39)
LDL Chol Calc (NIH): 36 mg/dL (ref 0–99)
Triglycerides: 64 mg/dL (ref 0–149)
VLDL Cholesterol Cal: 13 mg/dL (ref 5–40)

## 2022-11-16 LAB — ECHOCARDIOGRAM COMPLETE
Area-P 1/2: 2.39 cm2
P 1/2 time: 448 msec
S' Lateral: 2.5 cm

## 2022-11-16 LAB — ALT: ALT: 23 IU/L (ref 0–44)

## 2022-11-16 MED ORDER — PERFLUTREN LIPID MICROSPHERE
1.0000 mL | INTRAVENOUS | Status: AC | PRN
  Administered 2022-11-16: 2 mL via INTRAVENOUS

## 2022-11-20 ENCOUNTER — Telehealth: Payer: Self-pay | Admitting: Internal Medicine

## 2022-11-20 ENCOUNTER — Telehealth: Payer: Self-pay | Admitting: Cardiovascular Disease

## 2022-11-20 NOTE — Telephone Encounter (Signed)
Error

## 2022-11-20 NOTE — Telephone Encounter (Signed)
Calling in regards to his results. Please advise

## 2022-11-22 NOTE — Telephone Encounter (Signed)
Results reviewed with pt on 11/20/22 please see result notes.

## 2022-11-23 ENCOUNTER — Ambulatory Visit: Payer: Medicare Other | Admitting: Internal Medicine

## 2022-11-27 DIAGNOSIS — M179 Osteoarthritis of knee, unspecified: Secondary | ICD-10-CM | POA: Diagnosis not present

## 2022-11-27 DIAGNOSIS — I5030 Unspecified diastolic (congestive) heart failure: Secondary | ICD-10-CM | POA: Diagnosis not present

## 2022-11-27 DIAGNOSIS — N400 Enlarged prostate without lower urinary tract symptoms: Secondary | ICD-10-CM | POA: Diagnosis not present

## 2022-11-27 DIAGNOSIS — I10 Essential (primary) hypertension: Secondary | ICD-10-CM | POA: Diagnosis not present

## 2022-11-27 DIAGNOSIS — E782 Mixed hyperlipidemia: Secondary | ICD-10-CM | POA: Diagnosis not present

## 2022-12-04 DIAGNOSIS — Z7901 Long term (current) use of anticoagulants: Secondary | ICD-10-CM | POA: Diagnosis not present

## 2022-12-06 DIAGNOSIS — Z7901 Long term (current) use of anticoagulants: Secondary | ICD-10-CM | POA: Diagnosis not present

## 2022-12-24 ENCOUNTER — Ambulatory Visit: Payer: Medicare Other | Attending: Internal Medicine | Admitting: Internal Medicine

## 2022-12-24 ENCOUNTER — Encounter: Payer: Self-pay | Admitting: Internal Medicine

## 2022-12-24 VITALS — BP 112/62 | HR 85 | Ht 67.0 in | Wt 237.0 lb

## 2022-12-24 DIAGNOSIS — I10 Essential (primary) hypertension: Secondary | ICD-10-CM

## 2022-12-24 DIAGNOSIS — I2510 Atherosclerotic heart disease of native coronary artery without angina pectoris: Secondary | ICD-10-CM | POA: Diagnosis not present

## 2022-12-24 DIAGNOSIS — E782 Mixed hyperlipidemia: Secondary | ICD-10-CM | POA: Insufficient documentation

## 2022-12-24 DIAGNOSIS — Z86711 Personal history of pulmonary embolism: Secondary | ICD-10-CM | POA: Insufficient documentation

## 2022-12-24 DIAGNOSIS — I2584 Coronary atherosclerosis due to calcified coronary lesion: Secondary | ICD-10-CM

## 2022-12-24 DIAGNOSIS — I351 Nonrheumatic aortic (valve) insufficiency: Secondary | ICD-10-CM | POA: Diagnosis not present

## 2022-12-24 DIAGNOSIS — I77810 Thoracic aortic ectasia: Secondary | ICD-10-CM | POA: Diagnosis not present

## 2022-12-24 DIAGNOSIS — I700 Atherosclerosis of aorta: Secondary | ICD-10-CM

## 2022-12-24 NOTE — Patient Instructions (Signed)
Medication Instructions:  Your physician recommends that you continue on your current medications as directed. Please refer to the Current Medication list given to you today.  *If you need a refill on your cardiac medications before your next appointment, please call your pharmacy*   Lab Work: NONE If you have labs (blood work) drawn today and your tests are completely normal, you will receive your results only by: [facility] (if you have MyChart) OR A paper copy in the mail If you have any lab test that is abnormal or we need to change your treatment, we will call you to review the results.   Testing/Procedures: DECEMBER 2024: Your physician has requested that you have an echocardiogram. Echocardiography is a painless test that uses sound waves to create images of your heart. It provides your doctor with information about the size and shape of your heart and how well your heart's chambers and valves are working. This procedure takes approximately one hour. There are no restrictions for this procedure. Please do NOT wear cologne, perfume, aftershave, or lotions (deodorant is allowed). Please arrive 15 minutes prior to your appointment time.    Follow-Up: At [facility], you and your health needs are our priority.  As part of our continuing mission to provide you with exceptional heart care, we have created designated Provider Care Teams.  These Care Teams include your primary Cardiologist (physician) and Advanced Practice Providers (APPs -  Physician Assistants and Nurse Practitioners) who all work together to provide you with the care you need, when you need it.  We recommend signing up for the patient portal called "MyChart".  Sign up information is provided on this After Visit Summary.  MyChart is used to connect with patients for Virtual Visits (Telemedicine).  Patients are able to view lab/test results, encounter notes, upcoming appointments, etc.  Non-urgent messages can  be sent to your provider as well.   To learn more about what you can do with MyChart, go to [email or web address].    Your next appointment:   11 month(s)  Provider:   Werner Lean, MD

## 2022-12-24 NOTE — Progress Notes (Signed)
Cardiology Office Note:    Date:  12/24/2022   ID:  MACHAI LAW, DOB [birth year], MRN [id number]  PCP:  Wenda Low, MD   [facility] Providers Cardiologist:  Werner Lean, MD     Referring MD: Wenda Low, MD   CC: Post echo f/u  History of Present Illness:    Alexander Robles is a 72 y.o. 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  LDL was slightly above goal in 03/30/22 labs. 2023: has worked to stop drinking  Patient notes that he is doing better.   He started drinking more water. Started eating more baked foods. Starting eat less red meat. Is next working on eating more whole fruits. Has been alcohol free for 6 weeks There are no interval hospital/ED visit.    No chest pain or pressure .  No SOB/DOE and no PND/Orthopnea.  No weight gain or leg swelling (his legs do swell when he drinks alcohol).  No palpitations or syncope .   Past Medical History:  Diagnosis Date   Carpal tunnel syndrome    Cubital tunnel syndrome on right    Hypertension    states under control with meds., has been on med. x 5 yr.   Skin disease, fungal    on back   Wears partial dentures    upper    Past Surgical History:  Procedure Laterality Date   CARPAL TUNNEL RELEASE Right 08/02/2017   Procedure: RIGHT CARPAL TUNNEL RELEASE;  Surgeon: Daryll Brod, MD;  Location: [facility];  Service: Orthopedics;  Laterality: Right;  AX BLOCK   COLONOSCOPY     ULNAR NERVE TRANSPOSITION Right 08/02/2017   Procedure: RIGHT ULNAR NERVE DECOMPRESSION;  Surgeon: Daryll Brod, MD;  Location: [facility];  Service: Orthopedics;  Laterality: Right;    Current Medications: Current Meds  Medication Sig   acetaminophen (TYLENOL) 500 MG tablet Take 500 mg by mouth every 6 (six) hours as needed for moderate pain or headache.   furosemide (LASIX) 40 MG tablet  Take 40 mg by mouth every morning.   losartan (COZAAR) 50 MG tablet Take 50 mg by mouth daily.   Multiple Vitamins-Minerals (CENTRUM SILVER 50+MEN) TABS Take 1 tablet by mouth daily.   Omega-3 Fatty Acids (FISH OIL) 1000 MG CPDR Take 1,000 mg by mouth daily.   potassium chloride (KLOR-CON M) 10 MEQ tablet Take 10 mEq by mouth daily.   rosuvastatin (CRESTOR) 10 MG tablet Take 1 tablet (10 mg total) by mouth daily.   sildenafil (VIAGRA) 100 MG tablet Take 100 mg by mouth daily as needed.   warfarin (COUMADIN) 5 MG tablet Take 1 tablet (5 mg total) by mouth daily.     Allergies:   Patient has no known allergies.   Social History   Socioeconomic History   Marital status: Divorced    Spouse name: Not on file   Number of children: Not on file   Years of education: Not on file   Highest education level: Not on file  Occupational History   Not on file  Tobacco Use   Smoking status: Never   Smokeless tobacco: Never  Vaping Use   Vaping Use: Never used  Substance and Sexual Activity   Alcohol use: Not Currently    Comment: 7 drinks/week.  05/23/2021-states he stopped drinking 2 weeks ago   Drug use: No  Sexual activity: Not on file  Other Topics Concern   Not on file  Social History Narrative   Not on file   Social Determinants of Health   Financial Resource Strain: Not on file  Food Insecurity: No Food Insecurity (07/04/2022)   Hunger Vital Sign    Worried About Running Out of Food in the Last Year: Never true    Ran Out of Food in the Last Year: Never true  Transportation Needs: No Transportation Needs (07/04/2022)   PRAPARE - Hydrologist (Medical): No    Lack of Transportation (Non-Medical): No  Physical Activity: Not on file  Stress: Not on file  Social Connections: Not on file    Social: former meat packing working, has had a difficult relation ship with alcohol  Family History: History of coronary artery disease notable for no  members. History of heart failure notable for no members. History of arrhythmia notable for no members. Family history of DM  ROS:   Please see the history of present illness.     All other systems reviewed and are negative.  EKGs/Labs/Other Studies Reviewed:    The following studies were reviewed today:  EKG:   05/14/21: SR 95 anterior infarct pattern  Cardiac Studies & Procedures       ECHOCARDIOGRAM  ECHOCARDIOGRAM COMPLETE 11/16/2022  Narrative ECHOCARDIOGRAM REPORT    Patient Name:   Alexander Robles Doctor Date of Exam: 11/16/2022 Medical Rec #:  [id number]        Height:       67.0 in Accession #:    [id number]       Weight:       229.8 lb Date of Birth:  [birth year]        BSA:          2.145 m Patient Age:    51 years         BP:           143/91 mmHg Patient Gender: M                HR:           79 bpm. Exam Location:  [place]  Procedure: 2D Echo, Cardiac Doppler, Color Doppler and Intracardiac Opacification Agent  Indications:    I35.1 Nonrheumatic aortic (valve) insufficiency; I71.2 Ascending aortic aneurysm  History:        Patient has prior history of Echocardiogram examinations, most recent 12/01/2021. CHF, Signs/Symptoms:Shortness of Breath; Risk Factors:Hypertension and Dyslipidemia. Bilateral lower extremity edema. History of pulmonary embolus. Obesity. Aortic atherosclerosis.  Sonographer:    Diamond Nickel RCS Referring Phys: [id number] [facility] A Tysen Roesler  IMPRESSIONS   1. Left ventricular ejection fraction, by estimation, is 60 to 65%. The left ventricle has normal function. The left ventricle has no regional wall motion abnormalities. Left ventricular diastolic parameters are consistent with Grade I diastolic dysfunction (impaired relaxation). 2. Right ventricular systolic function is normal. The right ventricular size is normal. 3. The mitral valve is normal in structure. No evidence of mitral valve regurgitation. 4. The aortic valve is  tricuspid. There is mild calcification of the aortic valve. Aortic valve regurgitation is mild to moderate. Aortic valve sclerosis/calcification is present, without any evidence of aortic stenosis. Aortic regurgitation PHT measures 448 msec. 5. Aortic dilatation noted. There is borderline dilatation of the aortic root, measuring 39 mm. There is mild dilatation of the ascending aorta, measuring 40 mm. 6. The inferior vena cava is normal in  size with greater than 50% respiratory variability, suggesting right atrial pressure of 3 mmHg.  Comparison(s): No significant change from prior study. Prior images reviewed side by side.  FINDINGS Left Ventricle: Left ventricular ejection fraction, by estimation, is 60 to 65%. The left ventricle has normal function. The left ventricle has no regional wall motion abnormalities. The left ventricular internal cavity size was normal in size. There is borderline concentric left ventricular hypertrophy. Left ventricular diastolic parameters are consistent with Grade I diastolic dysfunction (impaired relaxation). Normal left ventricular filling pressure.  Right Ventricle: The right ventricular size is normal. No increase in right ventricular wall thickness. Right ventricular systolic function is normal.  Left Atrium: Left atrial size was normal in size.  Right Atrium: Right atrial size was normal in size.  Pericardium: There is no evidence of pericardial effusion.  Mitral Valve: The mitral valve is normal in structure. Mild mitral annular calcification. No evidence of mitral valve regurgitation.  Tricuspid Valve: The tricuspid valve is normal in structure. Tricuspid valve regurgitation is not demonstrated.  Aortic Valve: The aortic valve is tricuspid. There is mild calcification of the aortic valve. Aortic valve regurgitation is mild to moderate. Aortic regurgitation PHT measures 448 msec. Aortic valve sclerosis/calcification is present, without any evidence of  aortic stenosis.  Pulmonic Valve: The pulmonic valve was normal in structure. Pulmonic valve regurgitation is not visualized.  Aorta: Aortic dilatation noted. There is borderline dilatation of the aortic root, measuring 39 mm. There is mild dilatation of the ascending aorta, measuring 40 mm.  Venous: The inferior vena cava is normal in size with greater than 50% respiratory variability, suggesting right atrial pressure of 3 mmHg.  IAS/Shunts: No atrial level shunt detected by color flow Doppler.   LEFT VENTRICLE PLAX 2D LVIDd:         4.80 cm   Diastology LVIDs:         2.50 cm   LV e' medial:    8.81 cm/s LV PW:         1.10 cm   LV E/e' medial:  7.9 LV IVS:        1.20 cm   LV e' lateral:   7.94 cm/s LVOT diam:     2.20 cm   LV E/e' lateral: 8.7 LV SV:         95 LV SV Index:   44 LVOT Area:     3.80 cm   RIGHT VENTRICLE RV Basal diam:  2.30 cm RV S prime:     12.60 cm/s TAPSE (M-mode): 2.6 cm  LEFT ATRIUM             Index        RIGHT ATRIUM           Index LA diam:        2.90 cm 1.35 cm/m   RA Area:     12.70 cm LA Vol (A2C):   51.5 ml 24.01 ml/m  RA Volume:   25.50 ml  11.89 ml/m LA Vol (A4C):   20.3 ml 9.46 ml/m LA Biplane Vol: 35.2 ml 16.41 ml/m AORTIC VALVE LVOT Vmax:   139.00 cm/s LVOT Vmean:  82.400 cm/s LVOT VTI:    0.250 m AI PHT:      448 msec  AORTA Ao Root diam: 3.90 cm Ao Asc diam:  4.00 cm  MITRAL VALVE MV Area (PHT): 2.39 cm    SHUNTS MV Decel Time: 317 msec    Systemic VTI:  0.25 m  MV E velocity: 69.20 cm/s  Systemic Diam: 2.20 cm MV A velocity: 88.00 cm/s MV E/A ratio:  0.79  Mihai Croitoru MD Electronically signed by Sanda Klein MD Signature Date/Time: 11/16/2022/11:31:54 AM    Final              CTPE: Date: 05/24/21 Results: IMPRESSION: Positive for acute PE with CT evidence of right heart strain consistent with at least submassive (intermediate risk) PE. The presence of right heart strain has been associated with an  increased risk of morbidity and mortality. Please refer to the "PE Focused" order set in EPIC.  Recent Labs: 04/19/2022: BUN 6; Creatinine, Ser 0.84; Potassium 3.7; Sodium 140 11/16/2022: ALT 23  Recent Lipid Panel    Component Value Date/Time   CHOL 156 11/16/2022 0929   TRIG 64 11/16/2022 0929   HDL 107 11/16/2022 0929   CHOLHDL 1.5 11/16/2022 0929   CHOLHDL 3.4 05/26/2021 0551   VLDL 17 05/26/2021 0551   LDLCALC 36 11/16/2022 0929    Physical Exam:    VS:  BP 112/62   Pulse 85   Ht 5' 7"$  (1.702 m)   Wt 237 lb (107.5 kg)   SpO2 98%   BMI 37.12 kg/m     Wt Readings from Last 3 Encounters:  12/24/22 237 lb (107.5 kg)  07/10/22 229 lb 12.8 oz (104.2 kg)  04/19/22 232 lb (105.2 kg)    Gen: no distress, morbid obesity   Neck: No JVD,  Cardiac: No Rubs or Gallops, Soft holodiastolic murmur, normal rate, +2 radial pulses Respiratory: Clear to auscultation bilaterally, normal effort, normal  respiratory rate GI: Soft, nontender, non-distended  MS: No edema; all moves all extremities, walks  Integument: Skin feels warm Neuro:  At time of evaluation, alert and oriented to person/place/time/situation  Psych: Normal affect, patient feels better   ASSESSMENT:    1. Essential hypertension   2. Mixed hyperlipidemia   3. Morbid obesity ([place])   4. Aortic atherosclerosis ([place])   5. Coronary artery calcification   6. Nonrheumatic aortic valve insufficiency   7. 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  Concerns  regarding medicines are outlined above.  No orders of the defined types were placed in this encounter.  No orders of the defined types were placed in this encounter.   There are no Patient Instructions on file for this visit.   Signed, Werner Lean, MD  12/24/2022 9:08 AM    [place]

## 2022-12-26 DIAGNOSIS — E782 Mixed hyperlipidemia: Secondary | ICD-10-CM | POA: Diagnosis not present

## 2022-12-26 DIAGNOSIS — Z7901 Long term (current) use of anticoagulants: Secondary | ICD-10-CM | POA: Diagnosis not present

## 2022-12-26 DIAGNOSIS — I10 Essential (primary) hypertension: Secondary | ICD-10-CM | POA: Diagnosis not present

## 2022-12-26 DIAGNOSIS — Z6835 Body mass index (BMI) 35.0-35.9, adult: Secondary | ICD-10-CM | POA: Diagnosis not present

## 2022-12-26 DIAGNOSIS — L0292 Furuncle, unspecified: Secondary | ICD-10-CM | POA: Diagnosis not present

## 2022-12-26 DIAGNOSIS — Z86711 Personal history of pulmonary embolism: Secondary | ICD-10-CM | POA: Diagnosis not present

## 2022-12-26 DIAGNOSIS — Z86718 Personal history of other venous thrombosis and embolism: Secondary | ICD-10-CM | POA: Diagnosis not present

## 2022-12-26 DIAGNOSIS — I5030 Unspecified diastolic (congestive) heart failure: Secondary | ICD-10-CM | POA: Diagnosis not present

## 2022-12-28 DIAGNOSIS — Z7901 Long term (current) use of anticoagulants: Secondary | ICD-10-CM | POA: Diagnosis not present

## 2023-02-18 DIAGNOSIS — Z7901 Long term (current) use of anticoagulants: Secondary | ICD-10-CM | POA: Diagnosis not present

## 2023-04-19 DIAGNOSIS — Z86718 Personal history of other venous thrombosis and embolism: Secondary | ICD-10-CM | POA: Diagnosis not present

## 2023-04-19 DIAGNOSIS — M179 Osteoarthritis of knee, unspecified: Secondary | ICD-10-CM | POA: Diagnosis not present

## 2023-04-19 DIAGNOSIS — Z86711 Personal history of pulmonary embolism: Secondary | ICD-10-CM | POA: Diagnosis not present

## 2023-04-19 DIAGNOSIS — I5030 Unspecified diastolic (congestive) heart failure: Secondary | ICD-10-CM | POA: Diagnosis not present

## 2023-04-19 DIAGNOSIS — I10 Essential (primary) hypertension: Secondary | ICD-10-CM | POA: Diagnosis not present

## 2023-07-19 DIAGNOSIS — M519 Unspecified thoracic, thoracolumbar and lumbosacral intervertebral disc disorder: Secondary | ICD-10-CM | POA: Diagnosis not present

## 2023-07-19 DIAGNOSIS — Z86711 Personal history of pulmonary embolism: Secondary | ICD-10-CM | POA: Diagnosis not present

## 2023-07-19 DIAGNOSIS — Z0000 Encounter for general adult medical examination without abnormal findings: Secondary | ICD-10-CM | POA: Diagnosis not present

## 2023-07-19 DIAGNOSIS — Z86718 Personal history of other venous thrombosis and embolism: Secondary | ICD-10-CM | POA: Diagnosis not present

## 2023-07-19 DIAGNOSIS — E782 Mixed hyperlipidemia: Secondary | ICD-10-CM | POA: Diagnosis not present

## 2023-07-19 DIAGNOSIS — M179 Osteoarthritis of knee, unspecified: Secondary | ICD-10-CM | POA: Diagnosis not present

## 2023-07-19 DIAGNOSIS — I712 Thoracic aortic aneurysm, without rupture, unspecified: Secondary | ICD-10-CM | POA: Diagnosis not present

## 2023-07-19 DIAGNOSIS — N400 Enlarged prostate without lower urinary tract symptoms: Secondary | ICD-10-CM | POA: Diagnosis not present

## 2023-07-19 DIAGNOSIS — I10 Essential (primary) hypertension: Secondary | ICD-10-CM | POA: Diagnosis not present

## 2023-07-19 DIAGNOSIS — R7303 Prediabetes: Secondary | ICD-10-CM | POA: Diagnosis not present

## 2023-07-19 DIAGNOSIS — I5030 Unspecified diastolic (congestive) heart failure: Secondary | ICD-10-CM | POA: Diagnosis not present

## 2023-07-19 DIAGNOSIS — Z23 Encounter for immunization: Secondary | ICD-10-CM | POA: Diagnosis not present

## 2023-10-18 ENCOUNTER — Ambulatory Visit (HOSPITAL_COMMUNITY): Payer: Medicare Other | Attending: Internal Medicine

## 2023-10-18 DIAGNOSIS — Z86711 Personal history of pulmonary embolism: Secondary | ICD-10-CM

## 2023-10-18 DIAGNOSIS — I700 Atherosclerosis of aorta: Secondary | ICD-10-CM

## 2023-10-18 DIAGNOSIS — I77810 Thoracic aortic ectasia: Secondary | ICD-10-CM

## 2023-10-18 DIAGNOSIS — I351 Nonrheumatic aortic (valve) insufficiency: Secondary | ICD-10-CM | POA: Diagnosis not present

## 2023-10-18 DIAGNOSIS — E782 Mixed hyperlipidemia: Secondary | ICD-10-CM

## 2023-10-18 DIAGNOSIS — I10 Essential (primary) hypertension: Secondary | ICD-10-CM

## 2023-10-18 DIAGNOSIS — I2510 Atherosclerotic heart disease of native coronary artery without angina pectoris: Secondary | ICD-10-CM

## 2023-10-18 LAB — ECHOCARDIOGRAM COMPLETE
Area-P 1/2: 2.1 cm2
P 1/2 time: 474 ms
S' Lateral: 1.9 cm

## 2023-10-20 ENCOUNTER — Other Ambulatory Visit: Payer: Self-pay | Admitting: Internal Medicine

## 2024-01-17 ENCOUNTER — Other Ambulatory Visit: Payer: Self-pay | Admitting: Internal Medicine

## 2024-03-31 DIAGNOSIS — Z86718 Personal history of other venous thrombosis and embolism: Secondary | ICD-10-CM | POA: Diagnosis not present

## 2024-03-31 DIAGNOSIS — Z6836 Body mass index (BMI) 36.0-36.9, adult: Secondary | ICD-10-CM | POA: Diagnosis not present

## 2024-03-31 DIAGNOSIS — I10 Essential (primary) hypertension: Secondary | ICD-10-CM | POA: Diagnosis not present

## 2024-03-31 DIAGNOSIS — I712 Thoracic aortic aneurysm, without rupture, unspecified: Secondary | ICD-10-CM | POA: Diagnosis not present

## 2024-03-31 DIAGNOSIS — E782 Mixed hyperlipidemia: Secondary | ICD-10-CM | POA: Diagnosis not present

## 2024-03-31 DIAGNOSIS — I5030 Unspecified diastolic (congestive) heart failure: Secondary | ICD-10-CM | POA: Diagnosis not present

## 2024-03-31 DIAGNOSIS — M179 Osteoarthritis of knee, unspecified: Secondary | ICD-10-CM | POA: Diagnosis not present

## 2024-03-31 DIAGNOSIS — M519 Unspecified thoracic, thoracolumbar and lumbosacral intervertebral disc disorder: Secondary | ICD-10-CM | POA: Diagnosis not present

## 2024-03-31 DIAGNOSIS — Z86711 Personal history of pulmonary embolism: Secondary | ICD-10-CM | POA: Diagnosis not present

## 2024-04-29 DIAGNOSIS — G8929 Other chronic pain: Secondary | ICD-10-CM | POA: Diagnosis not present

## 2024-04-29 DIAGNOSIS — M25562 Pain in left knee: Secondary | ICD-10-CM | POA: Diagnosis not present

## 2024-04-29 DIAGNOSIS — M25561 Pain in right knee: Secondary | ICD-10-CM | POA: Diagnosis not present

## 2024-05-27 DIAGNOSIS — M25561 Pain in right knee: Secondary | ICD-10-CM | POA: Diagnosis not present

## 2024-05-27 DIAGNOSIS — G8929 Other chronic pain: Secondary | ICD-10-CM | POA: Diagnosis not present

## 2024-05-27 DIAGNOSIS — M170 Bilateral primary osteoarthritis of knee: Secondary | ICD-10-CM | POA: Diagnosis not present

## 2024-05-27 DIAGNOSIS — M25562 Pain in left knee: Secondary | ICD-10-CM | POA: Diagnosis not present

## 2024-08-26 ENCOUNTER — Other Ambulatory Visit: Payer: Self-pay | Admitting: Internal Medicine

## 2024-08-26 DIAGNOSIS — B360 Pityriasis versicolor: Secondary | ICD-10-CM | POA: Diagnosis not present

## 2024-08-26 DIAGNOSIS — I5030 Unspecified diastolic (congestive) heart failure: Secondary | ICD-10-CM | POA: Diagnosis not present

## 2024-08-26 DIAGNOSIS — R7303 Prediabetes: Secondary | ICD-10-CM | POA: Diagnosis not present

## 2024-08-26 DIAGNOSIS — I10 Essential (primary) hypertension: Secondary | ICD-10-CM | POA: Diagnosis not present

## 2024-08-26 DIAGNOSIS — Z0000 Encounter for general adult medical examination without abnormal findings: Secondary | ICD-10-CM | POA: Diagnosis not present

## 2024-08-26 DIAGNOSIS — N400 Enlarged prostate without lower urinary tract symptoms: Secondary | ICD-10-CM | POA: Diagnosis not present

## 2024-08-26 DIAGNOSIS — Z1331 Encounter for screening for depression: Secondary | ICD-10-CM | POA: Diagnosis not present

## 2024-08-26 DIAGNOSIS — M179 Osteoarthritis of knee, unspecified: Secondary | ICD-10-CM | POA: Diagnosis not present

## 2024-08-26 DIAGNOSIS — E782 Mixed hyperlipidemia: Secondary | ICD-10-CM | POA: Diagnosis not present

## 2024-08-26 DIAGNOSIS — I712 Thoracic aortic aneurysm, without rupture, unspecified: Secondary | ICD-10-CM | POA: Diagnosis not present

## 2024-08-26 DIAGNOSIS — Z86711 Personal history of pulmonary embolism: Secondary | ICD-10-CM | POA: Diagnosis not present

## 2024-09-03 DIAGNOSIS — M1712 Unilateral primary osteoarthritis, left knee: Secondary | ICD-10-CM | POA: Diagnosis not present

## 2024-09-03 DIAGNOSIS — M1711 Unilateral primary osteoarthritis, right knee: Secondary | ICD-10-CM | POA: Diagnosis not present

## 2024-09-09 ENCOUNTER — Ambulatory Visit
Admission: RE | Admit: 2024-09-09 | Discharge: 2024-09-09 | Disposition: A | Discharge status: Home / Self Care | Payer: PRIVATE HEALTH INSURANCE | Source: Ambulatory Visit | Attending: Internal Medicine | Admitting: Internal Medicine

## 2024-09-09 DIAGNOSIS — I712 Thoracic aortic aneurysm, without rupture, unspecified: Secondary | ICD-10-CM

## 2024-09-09 DIAGNOSIS — I7121 Aneurysm of the ascending aorta, without rupture: Secondary | ICD-10-CM | POA: Diagnosis not present

## 2024-09-09 MED ORDER — IOPAMIDOL (ISOVUE-370) INJECTION 76%
80.0000 mL | Freq: Once | INTRAVENOUS | Status: AC | PRN
  Administered 2024-09-09: 80 mL via INTRAVENOUS
